# Patient Record
Sex: Female | Born: 1935 | Race: White | Hispanic: No | Smoking: Never smoker
Health system: Southern US, Community
[De-identification: ages and names within clinical notes are randomized; demographics above are authoritative.]

## PROBLEM LIST (undated history)

## (undated) DIAGNOSIS — J449 Chronic obstructive pulmonary disease, unspecified: Secondary | ICD-10-CM

## (undated) DIAGNOSIS — I219 Acute myocardial infarction, unspecified: Secondary | ICD-10-CM

## (undated) DIAGNOSIS — I10 Essential (primary) hypertension: Secondary | ICD-10-CM

## (undated) DIAGNOSIS — E785 Hyperlipidemia, unspecified: Secondary | ICD-10-CM

## (undated) HISTORY — PX: BREAST SURGERY: SHX581

## (undated) HISTORY — PX: ABDOMINAL HYSTERECTOMY: SHX81

---

## 2010-03-19 ENCOUNTER — Encounter (HOSPITAL_COMMUNITY)
Admission: RE | Admit: 2010-03-19 | Discharge: 2010-04-14 | Payer: Self-pay | Source: Home / Self Care | Attending: Cardiology | Admitting: Cardiology

## 2010-04-15 ENCOUNTER — Encounter (HOSPITAL_COMMUNITY): Payer: Medicare Other | Attending: Cardiology

## 2010-04-15 DIAGNOSIS — Z5189 Encounter for other specified aftercare: Secondary | ICD-10-CM | POA: Insufficient documentation

## 2010-04-15 DIAGNOSIS — I10 Essential (primary) hypertension: Secondary | ICD-10-CM | POA: Insufficient documentation

## 2010-04-15 DIAGNOSIS — R0602 Shortness of breath: Secondary | ICD-10-CM | POA: Insufficient documentation

## 2010-04-15 DIAGNOSIS — E7800 Pure hypercholesterolemia, unspecified: Secondary | ICD-10-CM | POA: Insufficient documentation

## 2010-04-15 DIAGNOSIS — I2510 Atherosclerotic heart disease of native coronary artery without angina pectoris: Secondary | ICD-10-CM | POA: Insufficient documentation

## 2010-04-15 DIAGNOSIS — Z8249 Family history of ischemic heart disease and other diseases of the circulatory system: Secondary | ICD-10-CM | POA: Insufficient documentation

## 2010-04-15 DIAGNOSIS — Z87891 Personal history of nicotine dependence: Secondary | ICD-10-CM | POA: Insufficient documentation

## 2010-04-17 ENCOUNTER — Encounter (HOSPITAL_COMMUNITY): Payer: Medicare Other

## 2010-04-20 ENCOUNTER — Encounter (HOSPITAL_COMMUNITY): Payer: Medicare Other

## 2010-04-22 ENCOUNTER — Encounter (HOSPITAL_COMMUNITY): Payer: Medicare Other

## 2010-04-24 ENCOUNTER — Encounter (HOSPITAL_COMMUNITY): Payer: Medicare Other

## 2010-04-27 ENCOUNTER — Encounter (HOSPITAL_COMMUNITY): Payer: Medicare Other

## 2010-04-29 ENCOUNTER — Encounter (HOSPITAL_COMMUNITY): Payer: Medicare Other

## 2010-05-01 ENCOUNTER — Encounter (HOSPITAL_COMMUNITY): Payer: Medicare Other

## 2010-05-04 ENCOUNTER — Encounter (HOSPITAL_COMMUNITY): Payer: Medicare Other

## 2010-05-06 ENCOUNTER — Encounter (HOSPITAL_COMMUNITY): Payer: Medicare Other

## 2010-05-08 ENCOUNTER — Encounter (HOSPITAL_COMMUNITY): Payer: Medicare Other

## 2010-05-11 ENCOUNTER — Encounter (HOSPITAL_COMMUNITY): Payer: Medicare Other

## 2010-05-13 ENCOUNTER — Encounter (HOSPITAL_COMMUNITY): Payer: Medicare Other

## 2010-05-15 ENCOUNTER — Encounter (HOSPITAL_COMMUNITY): Payer: Medicare Other

## 2010-05-18 ENCOUNTER — Encounter (HOSPITAL_COMMUNITY): Payer: Medicare Other

## 2010-05-20 ENCOUNTER — Encounter (HOSPITAL_COMMUNITY): Payer: Medicare Other

## 2010-05-22 ENCOUNTER — Encounter (HOSPITAL_COMMUNITY): Payer: Medicare Other

## 2010-05-25 ENCOUNTER — Encounter (HOSPITAL_COMMUNITY): Payer: Medicare Other

## 2010-05-27 ENCOUNTER — Encounter (HOSPITAL_COMMUNITY): Payer: Medicare Other

## 2010-05-29 ENCOUNTER — Encounter (HOSPITAL_COMMUNITY): Payer: Medicare Other

## 2010-06-01 ENCOUNTER — Encounter (HOSPITAL_COMMUNITY): Payer: Medicare Other

## 2010-06-03 ENCOUNTER — Encounter (HOSPITAL_COMMUNITY): Payer: Medicare Other

## 2010-06-05 ENCOUNTER — Encounter (HOSPITAL_COMMUNITY): Payer: Medicare Other

## 2010-06-08 ENCOUNTER — Encounter (HOSPITAL_COMMUNITY): Payer: Medicare Other

## 2010-06-10 ENCOUNTER — Encounter (HOSPITAL_COMMUNITY): Payer: Medicare Other

## 2010-06-12 ENCOUNTER — Encounter (HOSPITAL_COMMUNITY): Payer: Medicare Other

## 2010-06-15 ENCOUNTER — Encounter (HOSPITAL_COMMUNITY): Payer: Medicare Other

## 2010-06-17 ENCOUNTER — Encounter (HOSPITAL_COMMUNITY): Payer: Medicare Other

## 2010-06-19 ENCOUNTER — Encounter (HOSPITAL_COMMUNITY): Payer: Medicare Other

## 2010-06-22 ENCOUNTER — Encounter (HOSPITAL_COMMUNITY): Payer: Medicare Other

## 2010-06-24 ENCOUNTER — Encounter (HOSPITAL_COMMUNITY): Payer: Medicare Other

## 2010-06-26 ENCOUNTER — Encounter (HOSPITAL_COMMUNITY): Payer: Medicare Other

## 2010-06-29 ENCOUNTER — Encounter (HOSPITAL_COMMUNITY): Payer: Medicare Other

## 2010-07-01 ENCOUNTER — Encounter (HOSPITAL_COMMUNITY): Payer: Medicare Other

## 2010-07-03 ENCOUNTER — Encounter (HOSPITAL_COMMUNITY): Payer: Medicare Other

## 2010-07-06 ENCOUNTER — Encounter (HOSPITAL_COMMUNITY): Payer: Medicare Other

## 2014-04-24 ENCOUNTER — Emergency Department (HOSPITAL_COMMUNITY)
Admission: EM | Admit: 2014-04-24 | Discharge: 2014-04-25 | Disposition: A | Discharge status: Home / Self Care | Payer: Medicare Other | Attending: Emergency Medicine | Admitting: Emergency Medicine

## 2014-04-24 ENCOUNTER — Emergency Department (HOSPITAL_COMMUNITY): Payer: Medicare Other

## 2014-04-24 ENCOUNTER — Encounter (HOSPITAL_COMMUNITY): Payer: Self-pay | Admitting: Emergency Medicine

## 2014-04-24 DIAGNOSIS — Z7902 Long term (current) use of antithrombotics/antiplatelets: Secondary | ICD-10-CM | POA: Diagnosis not present

## 2014-04-24 DIAGNOSIS — K224 Dyskinesia of esophagus: Secondary | ICD-10-CM | POA: Diagnosis not present

## 2014-04-24 DIAGNOSIS — R0789 Other chest pain: Secondary | ICD-10-CM | POA: Insufficient documentation

## 2014-04-24 DIAGNOSIS — Z7982 Long term (current) use of aspirin: Secondary | ICD-10-CM | POA: Insufficient documentation

## 2014-04-24 DIAGNOSIS — E785 Hyperlipidemia, unspecified: Secondary | ICD-10-CM | POA: Insufficient documentation

## 2014-04-24 DIAGNOSIS — Z79899 Other long term (current) drug therapy: Secondary | ICD-10-CM | POA: Diagnosis not present

## 2014-04-24 DIAGNOSIS — J449 Chronic obstructive pulmonary disease, unspecified: Secondary | ICD-10-CM | POA: Insufficient documentation

## 2014-04-24 DIAGNOSIS — R079 Chest pain, unspecified: Secondary | ICD-10-CM | POA: Diagnosis present

## 2014-04-24 DIAGNOSIS — I10 Essential (primary) hypertension: Secondary | ICD-10-CM | POA: Insufficient documentation

## 2014-04-24 HISTORY — DX: Essential (primary) hypertension: I10

## 2014-04-24 HISTORY — DX: Hyperlipidemia, unspecified: E78.5

## 2014-04-24 HISTORY — DX: Chronic obstructive pulmonary disease, unspecified: J44.9

## 2014-04-24 LAB — BASIC METABOLIC PANEL
ANION GAP: 6 (ref 5–15)
BUN: 9 mg/dL (ref 6–23)
CHLORIDE: 103 mmol/L (ref 96–112)
CO2: 28 mmol/L (ref 19–32)
Calcium: 8.7 mg/dL (ref 8.4–10.5)
Creatinine, Ser: 0.67 mg/dL (ref 0.50–1.10)
GFR calc Af Amer: >90 mL/min (ref >90)
GFR calc non Af Amer: 83 mL/min — ABNORMAL LOW (ref >90)
Glucose, Bld: 90 mg/dL (ref 70–99)
POTASSIUM: 4 mmol/L (ref 3.5–5.1)
Sodium: 137 mmol/L (ref 135–145)

## 2014-04-24 LAB — CBC
HEMATOCRIT: 39.2 % (ref 36.0–46.0)
HEMOGLOBIN: 12.8 g/dL (ref 12.0–15.0)
MCH: 27.9 pg (ref 26.0–34.0)
MCHC: 32.7 g/dL (ref 30.0–36.0)
MCV: 85.4 fL (ref 78.0–100.0)
Platelets: 210 10*3/uL (ref 150–400)
RBC: 4.59 MIL/uL (ref 3.87–5.11)
RDW: 14.1 % (ref 11.5–15.5)
WBC: 7.6 10*3/uL (ref 4.0–10.5)

## 2014-04-24 LAB — I-STAT TROPONIN, ED: Troponin i, poc: 0 ng/mL (ref 0.00–0.08)

## 2014-04-24 NOTE — ED Notes (Addendum)
Per EMS, pt comes from home visiting with son and daughter in law, with c/o sudden onset central chest pain radiating to throat.  Pt A&OX4, NAD noted. Pt has h/o MI and GERD. Pt had taken 2 nitro tabs and 324mg  ASA PO before EMS arrival and did not relieve pain. VSS. 1 nitro tab given en route, relieved pain.

## 2014-04-24 NOTE — ED Provider Notes (Signed)
ECG interpretation  Date: 04/24/2014  Rate: 58  Rhythm: normal sinus rhythm  QRS Axis: normal  Intervals: normal  ST/T Wave abnormalities: normal  Conduction Disutrbances: none  Narrative Interpretation:   Old EKG Reviewed: No significant changes noted     Lyanne CoKevin M Milissa Fesperman, MD 04/24/14 2337

## 2014-04-24 NOTE — ED Provider Notes (Signed)
CSN: [id number]     Arrival date & time 04/24/14  2127 History   First MD Initiated Contact with Patient 04/24/14 2128     Chief Complaint  Patient presents with  . Chest Pain     (Consider location/radiation/quality/duration/timing/severity/associated sxs/prior Treatment) HPI Teresa Wright is a 80 y.o. female with a history of MI, hypertension, hyperlipidemia comes in for evaluation of chest discomfort. Patient states earlier this evening at approximately 8:00 PM she began to eat a pasta dinner, when approximately 3 bites in she began to experience a burning sensation in the center of her chest that went from the bottom of her chest to her throat. She rates the discomfort as 8/10. She believes this sensation lasted for approximately 30 minutes until EMS arrived and gave her a sublingual nitroglycerin and then the discomfort resolved. She denies any discomfort now. She reports exercising regularly and has not experienced this discomfort after working out. She denies any chest pain, shortness of breath, cough, hemoptysis, leg swelling or recent travels or surgeries, history of blood clot, numbness or weakness, dizziness, syncope. Patient is on Plavix.  Past Medical History  Diagnosis Date  . Hypertension   . COPD (chronic obstructive pulmonary disease)   . Hyperlipidemia    Past Surgical History  Procedure Laterality Date  . Abdominal hysterectomy     History reviewed. No pertinent family history. History  Substance Use Topics  . Smoking status: Former Games developer  . Smokeless tobacco: Not on file  . Alcohol Use: 6.0 oz/week    10 Glasses of wine per week     Comment: 10 glasses of wine a week   OB History    No data available     Review of Systems  All other systems reviewed and are negative.  A 10 point review of systems was completed and was negative except for pertinent positives and negatives as mentioned in the history of present illness     Allergies  Strawberry  and Penicillins  Home Medications   Prior to Admission medications   Medication Sig Start Date End Date Taking? Authorizing Provider  albuterol (PROVENTIL HFA;VENTOLIN HFA) 108 (90 BASE) MCG/ACT inhaler Inhale 1 puff into the lungs every 6 (six) hours as needed for wheezing or shortness of breath.   Yes Historical Provider, MD  aspirin EC 81 MG tablet Take 81 mg by mouth daily.   Yes Historical Provider, MD  atorvastatin (LIPITOR) 40 MG tablet Take 40 mg by mouth at bedtime.   Yes Historical Provider, MD  cholecalciferol (VITAMIN D) 1000 UNITS tablet Take 1,000 Units by mouth daily.   Yes Historical Provider, MD  ciclesonide (ALVESCO) 160 MCG/ACT inhaler Inhale 2 puffs into the lungs 2 (two) times daily.   Yes Historical Provider, MD  clopidogrel (PLAVIX) 75 MG tablet Take 75 mg by mouth daily.   Yes Historical Provider, MD  Menthol-Methyl Salicylate (MUSCLE RUB) 10-15 % CREA Apply 1 application topically as needed for muscle pain.   Yes Historical Provider, MD  metoprolol succinate (TOPROL-XL) 25 MG 24 hr tablet Take 25 mg by mouth daily.   Yes Historical Provider, MD  montelukast (SINGULAIR) 10 MG tablet Take 10 mg by mouth at bedtime.   Yes Historical Provider, MD  nitroGLYCERIN (NITROSTAT) 0.4 MG SL tablet Place 0.4 mg under the tongue every 5 (five) minutes as needed for chest pain.   Yes Historical Provider, MD  OVER THE COUNTER MEDICATION Place 1 drop into both eyes at bedtime.    Yes  Historical Provider, MD  sertraline (ZOLOFT) 50 MG tablet Take 50 mg by mouth daily.   Yes Historical Provider, MD  tiotropium (SPIRIVA) 18 MCG inhalation capsule Place 18 mcg into inhaler and inhale daily.   Yes Historical Provider, MD  valsartan (DIOVAN) 160 MG tablet Take 160 mg by mouth daily.   Yes Historical Provider, MD   BP 152/95 mmHg  Pulse 59  Temp(Src) 98.1 F (36.7 C) (Oral)  Resp 13  Ht  (1.549 m)  Wt 103 lb (46.72 kg)  BMI 19.47 kg/m2  SpO2 92% Physical Exam  Constitutional: She  is oriented to person, place, and time. She appears well-developed and well-nourished.  HENT:  Head: Normocephalic and atraumatic.  Mouth/Throat: Oropharynx is clear and moist.  Eyes: Conjunctivae are normal. Pupils are equal, round, and reactive to light. Right eye exhibits no discharge. Left eye exhibits no discharge. No scleral icterus.  Neck: Neck supple.  Cardiovascular: Normal rate, regular rhythm and normal heart sounds.   Pulmonary/Chest: Effort normal. No respiratory distress. She has no wheezes. She has no rales.  Diminished breath sounds in all fields.  Abdominal: Soft. There is no tenderness.  Musculoskeletal: She exhibits no tenderness.  Neurological: She is alert and oriented to person, place, and time.  Cranial Nerves II-XII grossly intact  Skin: Skin is warm and dry. No rash noted.  Psychiatric: She has a normal mood and affect.  Nursing note and vitals reviewed.   ED Course  Procedures (including critical care time) Labs Review Labs Reviewed  BASIC METABOLIC PANEL - Abnormal; Notable for the following:    GFR calc non Af Amer 83 (*)    All other components within normal limits  CBC  I-STAT TROPOININ, ED    Imaging Review Dg Chest 2 View  04/24/2014   CLINICAL DATA:  Chest pain and shortness of breath.  EXAM: CHEST - 2 VIEW  COMPARISON:  None  FINDINGS: The heart size and mediastinal contours are within normal limits. There is evidence of underlying COPD. Bibasilar scarring and atelectasis identified. There is no evidence of pulmonary edema, consolidation, pneumothorax, nodule or pleural fluid. The visualized skeletal structures are unremarkable.  IMPRESSION: COPD with bibasilar scarring/ atelectasis.  No active disease.   Electronically Signed   By: Irish Lack M.D.   On: 04/24/2014 22:12     EKG Interpretation None     Meds given in ED:  Medications - No data to display  New Prescriptions   No medications on file   Filed Vitals:   04/24/14 2215  04/24/14 2230 04/24/14 2245 04/24/14 2300  BP: 152/61 169/57 156/62 152/95  Pulse: 55 63 56 59  Temp:      TempSrc:      Resp: Height:      Weight:      SpO2: 95% 95% 95% 92%    MDM  Vitals stable - WNL -afebrile Pt resting comfortably in ED. Reports she feels well and is ready to go home. PE--not concerning further acute or emergent pathology Labwork noncontributory Imaging--chest x-ray shows no acute cardiopulmonary pathology. No evidence of pneumothorax or esophageal rupture. No free air  DDX--patient likely suffered from esophageal spasm. Discussed f/u with gastroenterology if symptoms reoccur. Clinical picture not consistent with ACS, aortic dissection. Low Wells score, doubt PE. No PTX on CXR.  I discussed all relevant lab findings and imaging results with pt and they verbalized understanding. Discussed f/u with PCP within 48 hrs and return precautions,  pt very amenable to plan.  Prior to patient discharge, I discussed and reviewed this case with Dr. Patria Maneampos, who also saw and evaluated the patient  Final diagnoses:  Chest discomfort  Esophageal spasm        Sharlene MottsBenjamin W Eulia Hatcher, PA-C 04/25/14 1242  Lyanne CoKevin M Campos, MD 04/26/14 [phone]

## 2014-04-25 NOTE — Discharge Instructions (Signed)
Esophageal Spasm °Esophageal spasm is an uncoordinated contraction of the muscles of the esophagus (the tube which carries food from your mouth to your stomach). Normally, the muscles of the esophagus alternate between contraction and relaxation starting from the top of the esophagus and working down to the bottom. This moves the food from the mouth to the stomach. In esophageal spasm, all the muscles contract at once. This causes pain and fails to move the food along. As a result, you may have trouble swallowing.  °Women are more likely than men to have esophageal spasm. The cause of the spasms is not known. Sometimes eating hot or cold foods triggers the condition and this may be due to an overly sensitive esophagus. This is not an infectious disease and cannot be passed to others. °SYMPTOMS  °Symptoms of esophageal spasm may include: chest pain, burning or pain with swallowing, and difficulty swallowing.  °DIAGNOSIS  °Esophageal spasm can be diagnosed by a test called manometry (pressure studies of the esophagus). In this test, a special tube is inserted down the esophagus. The tube measures the muscle activity of the esophagus. Abnormal contractions mixed with normal movement helps confirm the diagnosis.  °A person with a hypersensitive esophagus may be diagnosed by inflating a long balloon in the person's esophagus. If this causes the same symptoms, preventive methods may work. °PREVENTION  °Avoid hot or cold foods if that seems to be a trigger. °PROGNOSIS  °This condition does not go away, nor is treatment entirely satisfactory. Patients need to be careful of what they eat. They need to continue on medication if a useful one is found. Fortunately, the condition does not get progressively worse as time passes. °Esophageal spasm does not usually lead to more serious problems but sometimes the pain can be disabling. If a person becomes afraid to eat they may become malnourished and lose weight.  °TREATMENT  °· A  procedure in which instruments of increasing size are inserted through the esophagus to enlarge (dilate) it are used. °· Medications that decrease acid-production of the stomach may be used such as proton-pump inhibitors or H2-blockers. °· Medications of several types can be used to relax the muscles of the esophagus. °· An individual with a hypersensitive esophagus sometimes improves with low doses of medications normally used for depression. °· No treatment for esophageal spasm is effective for everyone. Often several approaches will be tried before one works. In many cases, the symptoms will improve, but will not go away completely. °· For severe cases, relief is obtained two-thirds of the time by cutting the muscles along the entire length of the esophagus. This is a major surgical procedure. °· Your symptoms are usually the best guide to how well the treatment for esophageal spasm works. °SIDE EFFECTS OF TREATMENTS °· Nitrates can cause headaches and low blood pressure. °· Calcium channel blockers can cause: °¨ Feeling sick to your stomach (nausea). °¨ Constipation and other side effects. °· Antidepressants can cause side effects that depend on the medication used. °HOME CARE INSTRUCTIONS  °· Let your caregiver know if problems are getting worse, or if you get food stuck in your esophagus for longer than 1 hour or as directed and are unable to swallow liquid. °· Take medications as directed and with permission of your caregiver. Ask about what to do if a medication seems to get stuck in your esophagus. Only take over-the-counter or prescription medicines for pain, discomfort, or fever as directed by your caregiver. °· Soft and liquid foods   pass more easily than solid pieces. °SEEK IMMEDIATE MEDICAL CARE IF:  °· You develop severe chest pain, especially if the pain is crushing or pressure-like and spreads to the arms, back, neck, or jaw, or if you have sweating, nausea, or shortness of breath. THIS COULD BE AN  EMERGENCY. Do not wait to see if the pain will go away. Get medical help at once. Call 911 or 0 (operator). DO NOT drive yourself to the hospital. °· Your chest pain gets worse and does not go away with rest. °· You have an attack of chest pain lasting longer than usual despite rest and treatment with the medications your physician has prescribed. °· You wake from sleep with chest pain or shortness of breath. °· You feel dizzy or faint. °· You have chest pain, not typical of your usual pain, caused by your esophagus for which you originally saw your caregiver. °MAKE SURE YOU:  °· Understand these instructions. °· Will watch your condition. °· Will get help right away if you are not doing well or get worse. °Document Released: 05/22/2002 Document Revised: 05/24/2011 Document Reviewed: 05/25/2013 °ExitCare® Patient Information ©2015 [employer]. This information is not intended to replace advice given to you by your health care provider. Make sure you discuss any questions you have with your health care provider. ° °

## 2015-11-21 IMAGING — CR DG CHEST 2V
2 series · 2 of 2 positions shown · non-contrast
Comparison: None

CLINICAL DATA: Chest pain and shortness of breath.

EXAM:
CHEST - 2 VIEW

[chest pa]
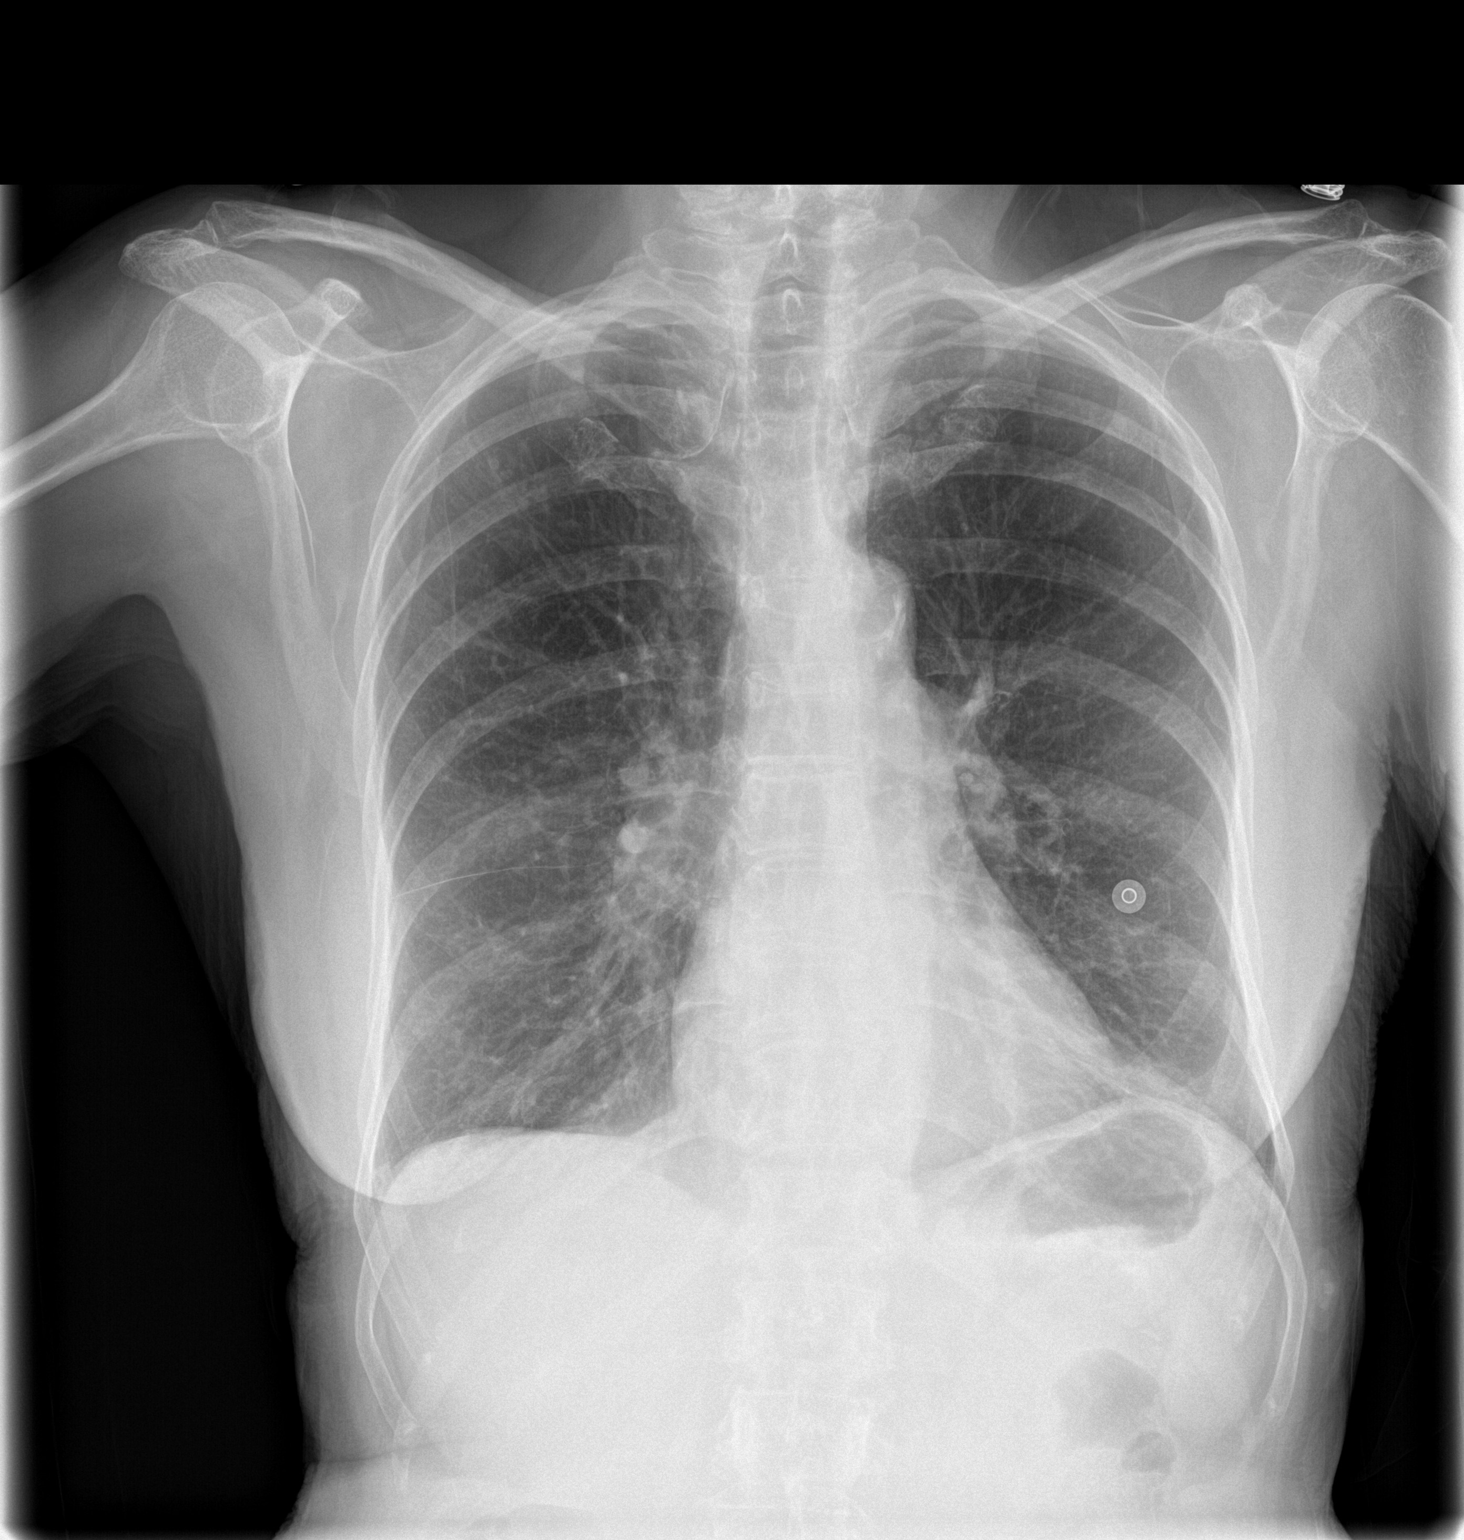

[chest lat]
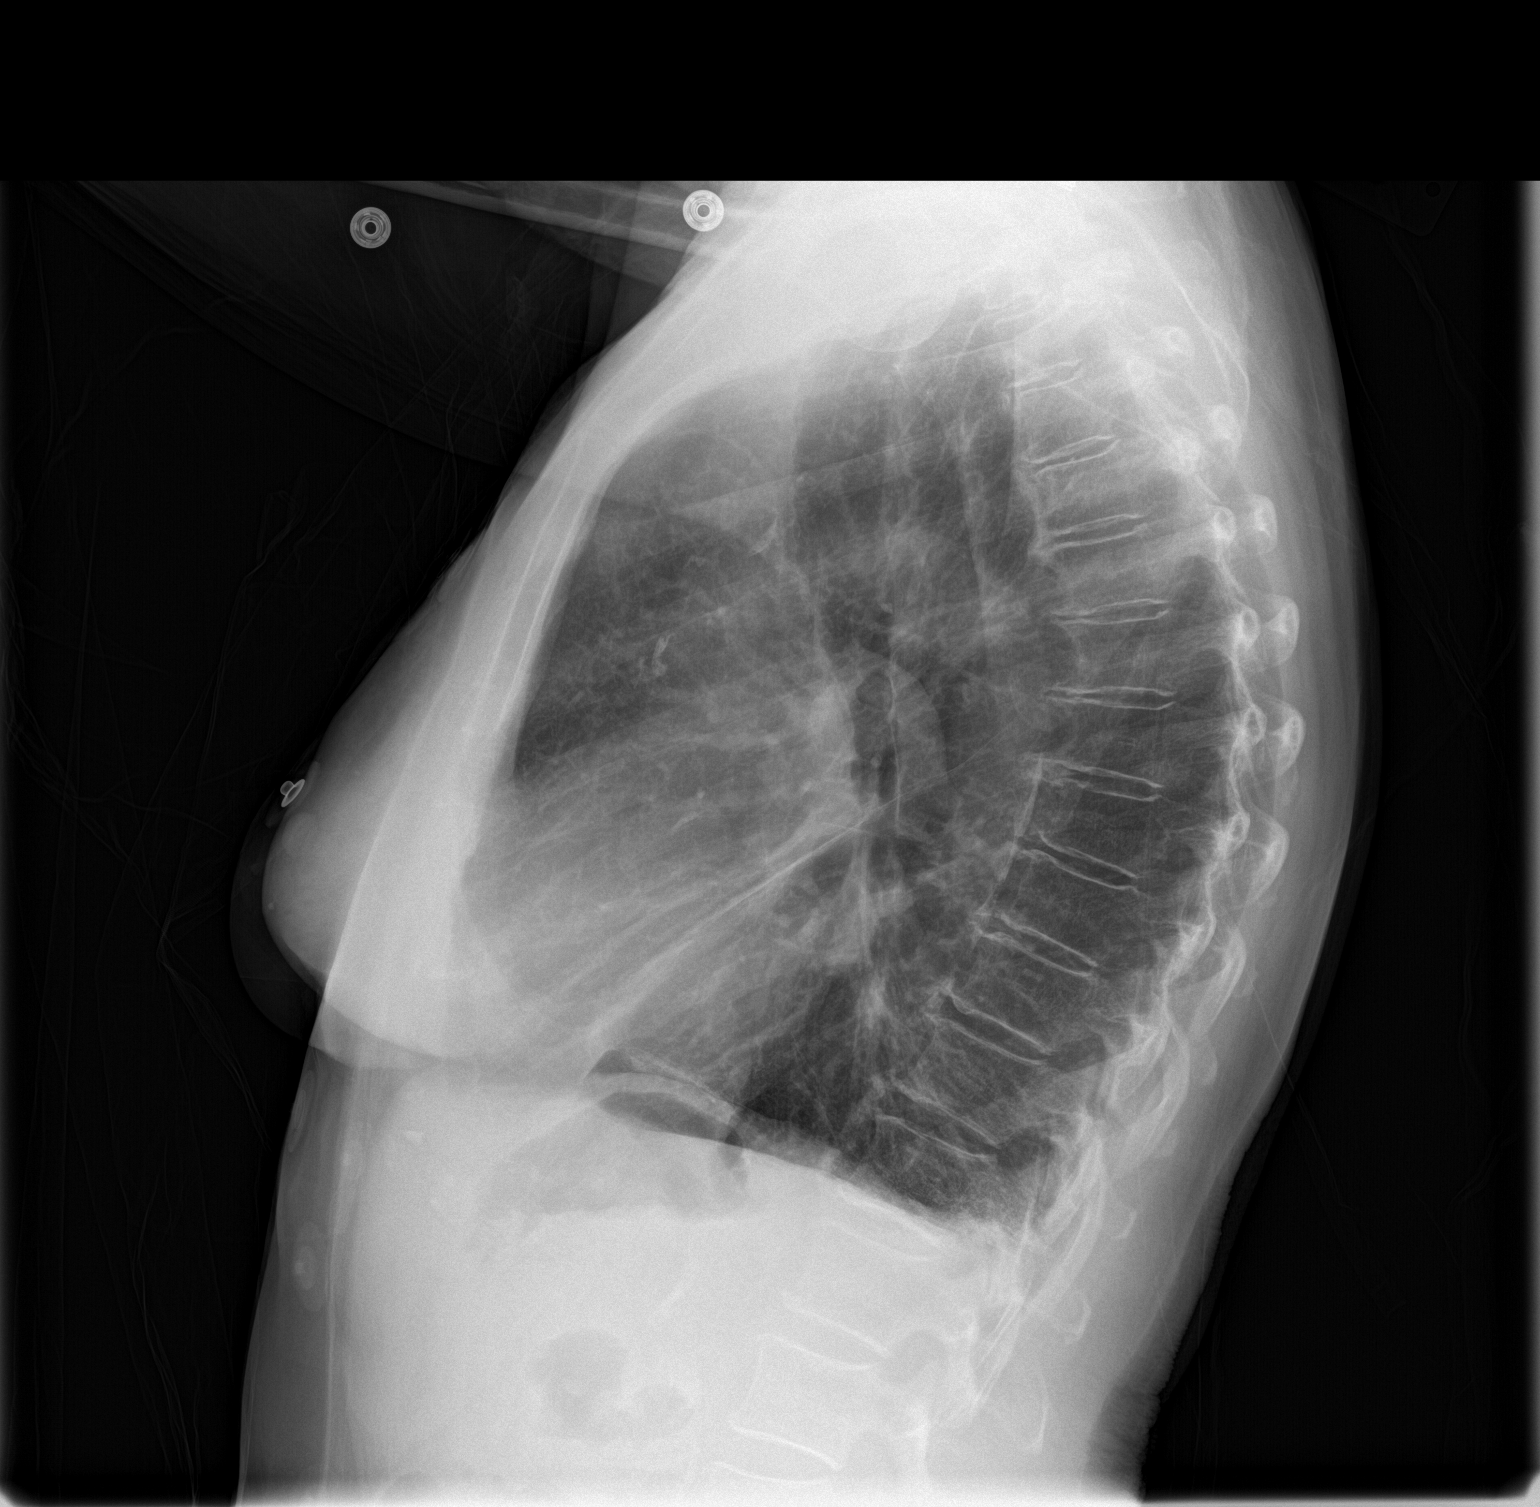

[2 of 2 positions shown; findings below may reference images not displayed]

FINDINGS: The heart size and mediastinal contours are within normal limits.
There is evidence of underlying COPD. Bibasilar scarring and
atelectasis identified. There is no evidence of pulmonary edema,
consolidation, pneumothorax, nodule or pleural fluid. The visualized
skeletal structures are unremarkable.
IMPRESSION: COPD with bibasilar scarring/ atelectasis.  No active disease.

## 2022-03-21 ENCOUNTER — Encounter (HOSPITAL_COMMUNITY): Payer: Self-pay

## 2022-03-21 ENCOUNTER — Inpatient Hospital Stay (HOSPITAL_BASED_OUTPATIENT_CLINIC_OR_DEPARTMENT_OTHER)
Admission: EM | Admit: 2022-03-21 | Discharge: 2022-04-15 | DRG: 329 | Disposition: E | Payer: Medicare Other | Attending: Student | Admitting: Student

## 2022-03-21 ENCOUNTER — Inpatient Hospital Stay (HOSPITAL_COMMUNITY): Payer: Medicare Other

## 2022-03-21 ENCOUNTER — Encounter (HOSPITAL_BASED_OUTPATIENT_CLINIC_OR_DEPARTMENT_OTHER): Payer: Self-pay

## 2022-03-21 ENCOUNTER — Emergency Department (HOSPITAL_BASED_OUTPATIENT_CLINIC_OR_DEPARTMENT_OTHER): Payer: Medicare Other

## 2022-03-21 ENCOUNTER — Other Ambulatory Visit: Payer: Self-pay

## 2022-03-21 DIAGNOSIS — E872 Acidosis, unspecified: Secondary | ICD-10-CM | POA: Diagnosis not present

## 2022-03-21 DIAGNOSIS — K56609 Unspecified intestinal obstruction, unspecified as to partial versus complete obstruction: Secondary | ICD-10-CM | POA: Diagnosis not present

## 2022-03-21 DIAGNOSIS — A419 Sepsis, unspecified organism: Secondary | ICD-10-CM

## 2022-03-21 DIAGNOSIS — N179 Acute kidney failure, unspecified: Secondary | ICD-10-CM | POA: Insufficient documentation

## 2022-03-21 DIAGNOSIS — J449 Chronic obstructive pulmonary disease, unspecified: Secondary | ICD-10-CM | POA: Insufficient documentation

## 2022-03-21 DIAGNOSIS — R739 Hyperglycemia, unspecified: Secondary | ICD-10-CM | POA: Insufficient documentation

## 2022-03-21 DIAGNOSIS — I2510 Atherosclerotic heart disease of native coronary artery without angina pectoris: Secondary | ICD-10-CM | POA: Insufficient documentation

## 2022-03-21 DIAGNOSIS — I10 Essential (primary) hypertension: Secondary | ICD-10-CM | POA: Insufficient documentation

## 2022-03-21 DIAGNOSIS — J9601 Acute respiratory failure with hypoxia: Secondary | ICD-10-CM | POA: Diagnosis not present

## 2022-03-21 DIAGNOSIS — F10939 Alcohol use, unspecified with withdrawal, unspecified: Secondary | ICD-10-CM | POA: Diagnosis present

## 2022-03-21 DIAGNOSIS — Z66 Do not resuscitate: Secondary | ICD-10-CM | POA: Diagnosis present

## 2022-03-21 DIAGNOSIS — F419 Anxiety disorder, unspecified: Secondary | ICD-10-CM | POA: Diagnosis present

## 2022-03-21 DIAGNOSIS — K660 Peritoneal adhesions (postprocedural) (postinfection): Secondary | ICD-10-CM | POA: Diagnosis present

## 2022-03-21 DIAGNOSIS — I252 Old myocardial infarction: Secondary | ICD-10-CM | POA: Diagnosis not present

## 2022-03-21 DIAGNOSIS — I878 Other specified disorders of veins: Secondary | ICD-10-CM | POA: Diagnosis present

## 2022-03-21 DIAGNOSIS — F29 Unspecified psychosis not due to a substance or known physiological condition: Secondary | ICD-10-CM | POA: Diagnosis not present

## 2022-03-21 DIAGNOSIS — K55029 Acute infarction of small intestine, extent unspecified: Secondary | ICD-10-CM | POA: Diagnosis present

## 2022-03-21 DIAGNOSIS — R6521 Severe sepsis with septic shock: Secondary | ICD-10-CM | POA: Diagnosis not present

## 2022-03-21 DIAGNOSIS — Z681 Body mass index (BMI) 19 or less, adult: Secondary | ICD-10-CM | POA: Diagnosis not present

## 2022-03-21 DIAGNOSIS — R042 Hemoptysis: Secondary | ICD-10-CM | POA: Diagnosis not present

## 2022-03-21 DIAGNOSIS — N170 Acute kidney failure with tubular necrosis: Secondary | ICD-10-CM | POA: Diagnosis not present

## 2022-03-21 DIAGNOSIS — Z90710 Acquired absence of both cervix and uterus: Secondary | ICD-10-CM

## 2022-03-21 DIAGNOSIS — J690 Pneumonitis due to inhalation of food and vomit: Secondary | ICD-10-CM | POA: Diagnosis not present

## 2022-03-21 DIAGNOSIS — R34 Anuria and oliguria: Secondary | ICD-10-CM | POA: Diagnosis not present

## 2022-03-21 DIAGNOSIS — I959 Hypotension, unspecified: Secondary | ICD-10-CM | POA: Diagnosis not present

## 2022-03-21 DIAGNOSIS — Z781 Physical restraint status: Secondary | ICD-10-CM

## 2022-03-21 DIAGNOSIS — D689 Coagulation defect, unspecified: Secondary | ICD-10-CM | POA: Diagnosis not present

## 2022-03-21 DIAGNOSIS — K7200 Acute and subacute hepatic failure without coma: Secondary | ICD-10-CM | POA: Diagnosis not present

## 2022-03-21 DIAGNOSIS — R54 Age-related physical debility: Secondary | ICD-10-CM | POA: Diagnosis present

## 2022-03-21 DIAGNOSIS — D62 Acute posthemorrhagic anemia: Secondary | ICD-10-CM | POA: Diagnosis not present

## 2022-03-21 DIAGNOSIS — G9341 Metabolic encephalopathy: Secondary | ICD-10-CM | POA: Diagnosis not present

## 2022-03-21 DIAGNOSIS — E162 Hypoglycemia, unspecified: Secondary | ICD-10-CM | POA: Diagnosis present

## 2022-03-21 DIAGNOSIS — F1090 Alcohol use, unspecified, uncomplicated: Secondary | ICD-10-CM | POA: Diagnosis present

## 2022-03-21 DIAGNOSIS — R112 Nausea with vomiting, unspecified: Secondary | ICD-10-CM | POA: Diagnosis present

## 2022-03-21 DIAGNOSIS — K760 Fatty (change of) liver, not elsewhere classified: Secondary | ICD-10-CM | POA: Diagnosis present

## 2022-03-21 DIAGNOSIS — R7401 Elevation of levels of liver transaminase levels: Secondary | ICD-10-CM | POA: Diagnosis not present

## 2022-03-21 DIAGNOSIS — E440 Moderate protein-calorie malnutrition: Secondary | ICD-10-CM | POA: Diagnosis present

## 2022-03-21 DIAGNOSIS — Z7902 Long term (current) use of antithrombotics/antiplatelets: Secondary | ICD-10-CM

## 2022-03-21 DIAGNOSIS — Z515 Encounter for palliative care: Secondary | ICD-10-CM

## 2022-03-21 DIAGNOSIS — Z880 Allergy status to penicillin: Secondary | ICD-10-CM

## 2022-03-21 HISTORY — DX: Acute myocardial infarction, unspecified: I21.9

## 2022-03-21 HISTORY — DX: Essential (primary) hypertension: I10

## 2022-03-21 HISTORY — DX: Chronic obstructive pulmonary disease, unspecified: J44.9

## 2022-03-21 LAB — COMPREHENSIVE METABOLIC PANEL
ALT: 14 U/L (ref 0–44)
AST: 16 U/L (ref 15–41)
Albumin: 3.9 g/dL (ref 3.5–5.0)
Alkaline Phosphatase: 78 U/L (ref 38–126)
Anion gap: 17 — ABNORMAL HIGH (ref 5–15)
BUN: 17 mg/dL (ref 8–23)
CO2: 26 mmol/L (ref 22–32)
Calcium: 9.5 mg/dL (ref 8.9–10.3)
Chloride: 100 mmol/L (ref 98–111)
Creatinine, Ser: 0.63 mg/dL (ref 0.44–1.00)
GFR, Estimated: >60 mL/min (ref >60)
Glucose, Bld: 163 mg/dL — ABNORMAL HIGH (ref 70–99)
Potassium: 3.9 mmol/L (ref 3.5–5.1)
Sodium: 143 mmol/L (ref 135–145)
Total Bilirubin: 0.5 mg/dL (ref 0.3–1.2)
Total Protein: 7.1 g/dL (ref 6.5–8.1)

## 2022-03-21 LAB — CBC
HCT: 47.3 % — ABNORMAL HIGH (ref 36.0–46.0)
Hemoglobin: 15 g/dL (ref 12.0–15.0)
MCH: 28.2 pg (ref 26.0–34.0)
MCHC: 31.7 g/dL (ref 30.0–36.0)
MCV: 88.9 fL (ref 80.0–100.0)
Platelets: 394 10*3/uL (ref 150–400)
RBC: 5.32 MIL/uL — ABNORMAL HIGH (ref 3.87–5.11)
RDW: 14.1 % (ref 11.5–15.5)
WBC: 9 10*3/uL (ref 4.0–10.5)
nRBC: 0 % (ref 0.0–0.2)

## 2022-03-21 LAB — LIPASE, BLOOD: Lipase: 16 U/L (ref 11–51)

## 2022-03-21 LAB — TROPONIN I (HIGH SENSITIVITY): Troponin I (High Sensitivity): 6 ng/L (ref <18)

## 2022-03-21 MED ORDER — IOHEXOL 300 MG/ML  SOLN
100.0000 mL | Freq: Once | INTRAMUSCULAR | Status: AC | PRN
  Administered 2022-03-21: 80 mL via INTRAVENOUS

## 2022-03-21 MED ORDER — HYDRALAZINE HCL 20 MG/ML IJ SOLN: 10.0000 mg | Freq: Three times a day (TID) | INTRAMUSCULAR | Status: DC | PRN

## 2022-03-21 MED ORDER — SODIUM CHLORIDE 0.9 % IV SOLN: INTRAVENOUS | Status: DC

## 2022-03-21 MED ORDER — SODIUM CHLORIDE 0.9 % IV BOLUS
1000.0000 mL | Freq: Once | INTRAVENOUS | Status: AC
  Administered 2022-03-21: 1000 mL via INTRAVENOUS

## 2022-03-21 MED ORDER — HYDROMORPHONE HCL 1 MG/ML IJ SOLN
0.5000 mg | INTRAMUSCULAR | Status: DC | PRN
  Administered 2022-03-21 – 2022-03-25 (×10): 0.5 mg via INTRAVENOUS
  Filled 2022-03-21 (×3): qty 1
  Filled 2022-03-21: qty 0.5
  Filled 2022-03-21: qty 1
  Filled 2022-03-21: qty 0.5
  Filled 2022-03-21: qty 1
  Filled 2022-03-21 (×2): qty 0.5
  Filled 2022-03-21 (×2): qty 1
  Filled 2022-03-21: qty 0.5

## 2022-03-21 MED ORDER — HYDROMORPHONE HCL 1 MG/ML IJ SOLN
0.5000 mg | Freq: Once | INTRAMUSCULAR | Status: AC
  Administered 2022-03-21: 0.5 mg via INTRAVENOUS
  Filled 2022-03-21: qty 1

## 2022-03-21 MED ORDER — ONDANSETRON HCL 4 MG/2ML IJ SOLN
4.0000 mg | Freq: Once | INTRAMUSCULAR | Status: AC | PRN
  Administered 2022-03-21: 4 mg via INTRAVENOUS
  Filled 2022-03-21: qty 2

## 2022-03-21 MED ORDER — KETOROLAC TROMETHAMINE 15 MG/ML IJ SOLN
15.0000 mg | Freq: Once | INTRAMUSCULAR | Status: AC
  Administered 2022-03-21: 15 mg via INTRAVENOUS
  Filled 2022-03-21: qty 1

## 2022-03-21 MED ORDER — PROCHLORPERAZINE EDISYLATE 10 MG/2ML IJ SOLN
10.0000 mg | Freq: Four times a day (QID) | INTRAMUSCULAR | Status: DC | PRN
  Administered 2022-03-21: 10 mg via INTRAVENOUS
  Filled 2022-03-21: qty 2

## 2022-03-21 MED ORDER — HYDRALAZINE HCL 20 MG/ML IJ SOLN: 10.0000 mg | Freq: Once | INTRAMUSCULAR | Status: DC

## 2022-03-21 MED ORDER — SODIUM CHLORIDE 0.9 % IV BOLUS
500.0000 mL | Freq: Once | INTRAVENOUS | Status: AC
  Administered 2022-03-21: 500 mL via INTRAVENOUS

## 2022-03-21 MED ORDER — DIATRIZOATE MEGLUMINE & SODIUM 66-10 % PO SOLN
90.0000 mL | Freq: Once | ORAL | Status: AC
  Administered 2022-03-21: 90 mL via NASOGASTRIC
  Filled 2022-03-21: qty 90

## 2022-03-21 MED ORDER — HYDROMORPHONE HCL 1 MG/ML IJ SOLN
1.0000 mg | Freq: Once | INTRAMUSCULAR | Status: AC
  Administered 2022-03-21: 1 mg via INTRAVENOUS
  Filled 2022-03-21: qty 1

## 2022-03-21 MED ORDER — DIATRIZOATE MEGLUMINE & SODIUM 66-10 % PO SOLN: 90.0000 mL | Freq: Once | ORAL | Status: DC

## 2022-03-21 NOTE — ED Notes (Signed)
Pt. C/o of increase in pain level. Dr. Sedonia Small aware and in to see pt.

## 2022-03-21 NOTE — ED Triage Notes (Signed)
Pt to ED via pov from home. Pt started having abdominal pain and vomiting approximately 4hrs ago. Pt has had pepto-bismol w/o relief. Pt denies diarrhea, constipation, and/or fevers.

## 2022-03-21 NOTE — ED Provider Notes (Signed)
  Physical Exam  BP (!) 195/85   Pulse 70   Temp (!) 94.3 F (34.6 C) (Oral)   Resp 11   Ht 1.549 m (5\' 1" )   Wt 42.6 kg   SpO2 (!) 89%   BMI 17.76 kg/m   Physical Exam  Procedures  Procedures  ED Course / MDM    Medical Decision Making Amount and/or Complexity of Data Reviewed Labs: ordered. Radiology: ordered.  Risk Prescription drug management. Decision regarding hospitalization.   88 yo female presents with abdominal pain, nausea and vomiting And found to have sbo on ct Accepted to hospitalist at [facility] and general surgery consulted Hydralazine and ng ordered       Pattricia Boss, MD 03/23/22 1200

## 2022-03-21 NOTE — Progress Notes (Signed)
Plan of Care Note for accepted transfer   Patient: Teresa Wright MRN: [id number]   DOA: 03/21/2022  Facility requesting transfer: [facility] Requesting Provider: Dr. Sedonia Small Reason for transfer: SBO Facility course: 20 F w/ PMHx of HTN, COPD. Presenting with N/V, ab pain. Acute pain starting last night. W/u revealed high-grade SBO. She was started on SBO protocol. EDP spoke with Dr. Dema Severin (General Surgery). They will follow. She is hypertensive. Getting PRN hydralazine.  Plan of care: The patient is accepted for admission to Progressive unit, at [facility]. While holding at [facility], medical decision making responsibilities remain with the [facility]. Upon arrival to [facility], [facility] will assume care. Thank you.   Author: Jonnie Finner, DO 03/21/2022  Check [email or web address] for on-call coverage.  Nursing staff, Please call [place] number on Amion as soon as patient's arrival, so appropriate admitting provider can evaluate the pt.

## 2022-03-21 NOTE — ED Notes (Signed)
Called [facility] and spoke to [facility]; informed her that the patient Bed Assignment is READY

## 2022-03-21 NOTE — ED Notes (Signed)
Purewick Cath placed. Tolerated well.

## 2022-03-21 NOTE — H&P (Signed)
History and Physical    Patient: Teresa Wright CBS:[id number] DOB: 1934/05/22 DOA: 03/21/2022 DOS: the patient was seen and examined on 03/21/2022 PCP: Pcp, No  Patient coming from: Home  Chief Complaint:  Chief Complaint  Patient presents with   Abdominal Pain   HPI: SHANTELLE ALLES is a 88 y.o. female with medical history significant of HTN, CAD, COPD. Presenting with abdominal pain. Her symptoms started last night around [place]. She woke with gassy, indigestion like pain across the top of her stomach. She tried some pepto bismol to help, but she just vomited it up. She didn't have any fever or diarrhea. Her pain worsened to include her flanks. When her symptoms did not improve this morning, she decided to come to the ED for evaluation. She denies any other aggravating or alleviating factors.    Review of Systems: As mentioned in the history of present illness. All other systems reviewed and are negative. Past Medical History:  Diagnosis Date   COPD (chronic obstructive pulmonary disease) (HCC)    Hypertension    MI (myocardial infarction) (HCC)    Past Surgical History:  Procedure Laterality Date   ABDOMINAL HYSTERECTOMY     BREAST SURGERY     Social History:  reports that she has never smoked. She has never used smokeless tobacco. She reports current alcohol use. She reports that she does not use drugs.  Allergies  Allergen Reactions   Penicillins     History reviewed. No pertinent family history.  Prior to Admission medications   Not on File    Physical Exam: Vitals:   03/21/22 0800 03/21/22 0805 03/21/22 0815 03/21/22 0830  BP: (!) 202/68   (!) 178/72  Pulse: 66 66 66 68  Resp: 16 14 12 12   Temp:      TempSrc:      SpO2: (!) 88% (!) 88% (!) 87% 90%  Weight:      Height:       General: 88 y.o. female resting in bed in NAD Eyes: PERRL, normal sclera ENMT: Nares patent w/o discharge, orophaynx clear, dentition normal, ears w/o  discharge/lesions/ulcers Neck: Supple, trachea midline Cardiovascular: RRR, +S1, S2, no m/g/r, equal pulses throughout Respiratory: CTABL, no w/r/r, normal WOB GI: BS hypoactive, ND, soft, TTP globally, no masses noted, no organomegaly noted, NGT in place MSK: No e/c/c Neuro: A&O x 3, no focal deficits Psyc: Appropriate interaction and affect, calm/cooperative  Data Reviewed:  Results for orders placed or performed during the hospital encounter of 03/21/22 (from the past 24 hour(s))  Lipase, blood     Status: None   Collection Time: 03/21/22  4:47 AM  Result Value Ref Range   Lipase 16 11 - 51 U/L  Comprehensive metabolic panel     Status: Abnormal   Collection Time: 03/21/22  4:47 AM  Result Value Ref Range   Sodium 143 135 - 145 mmol/L   Potassium 3.9 3.5 - 5.1 mmol/L   Chloride 100 98 - 111 mmol/L   CO2 26 22 - 32 mmol/L   Glucose, Bld 163 (H) 70 - 99 mg/dL   BUN 17 8 - 23 mg/dL   Creatinine, Ser 05/20/22 0.44 - 1.00 mg/dL   Calcium 9.5 8.9 - 8.46 mg/dL   Total Protein 7.1 6.5 - 8.1 g/dL   Albumin 3.9 3.5 - 5.0 g/dL   AST 16 15 - 41 U/L   ALT 14 0 - 44 U/L   Alkaline Phosphatase 78 38 - 126 U/L  Total Bilirubin 0.5 0.3 - 1.2 mg/dL   GFR, Estimated >60 >60 mL/min   Anion gap 17 (H) 5 - 15  CBC     Status: Abnormal   Collection Time: 03/21/22  4:47 AM  Result Value Ref Range   WBC 9.0 4.0 - 10.5 K/uL   RBC 5.32 (H) 3.87 - 5.11 MIL/uL   Hemoglobin 15.0 12.0 - 15.0 g/dL   HCT 47.3 (H) 36.0 - 46.0 %   MCV 88.9 80.0 - 100.0 fL   MCH 28.2 26.0 - 34.0 pg   MCHC 31.7 30.0 - 36.0 g/dL   RDW 14.1 11.5 - 15.5 %   Platelets 394 150 - 400 K/uL   nRBC 0.0 0.0 - 0.2 %  Troponin I (High Sensitivity)     Status: None   Collection Time: 03/21/22  4:47 AM  Result Value Ref Range   Troponin I (High Sensitivity) 6 <18 ng/L   CT ab/pelvis: 1. High-grade small bowel obstruction with transition point in the right lower quadrant. Associated mesenteric edema and trace ascites, no  perforation. 2. Advanced atherosclerosis with high-grade bilateral inflow stenoses.  CXR: The side port and distal tip of the NG tube are in the stomach as above.  Assessment and Plan: SBO     - admitted to inpt, progressive     - SBO protocol, NGT in place, keep NPO for now     - General surgery consulted     - fluids, anti-emetics, pain control  HTN     - PRN hydralazine for now     - resume home regimen when confirmed and off NPO status  CAD     - resume home regimen when confirmed and off NPO status  COPD     - resume home regimen when confirmed  Hyperglycemia     - check A1c  Advance Care Planning:   Code Status: DNR; confirmed through multiple lines of questioning  Consults: General Surgery  Family Communication: w/ daughter at bedside  Severity of Illness: The appropriate patient status for this patient is INPATIENT. Inpatient status is judged to be reasonable and necessary in order to provide the required intensity of service to ensure the patient's safety. The patient's presenting symptoms, physical exam findings, and initial radiographic and laboratory data in the context of their chronic comorbidities is felt to place them at high risk for further clinical deterioration. Furthermore, it is not anticipated that the patient will be medically stable for discharge from the hospital within 2 midnights of admission.   * I certify that at the point of admission it is my clinical judgment that the patient will require inpatient hospital care spanning beyond 2 midnights from the point of admission due to high intensity of service, high risk for further deterioration and high frequency of surveillance required.*  Author: Jonnie Finner, DO 03/21/2022 9:41 AM  For on call review [email or web address].

## 2022-03-21 NOTE — ED Notes (Signed)
Pt alert, NAD, calm, interactive, resps e/u. SPO2 low at 88% on 2L [place] with NGT in R nare intermittent low wall suction. O2 increased to 4L [place]. Will monitor. BP improved.

## 2022-03-21 NOTE — Consult Note (Signed)
Reason for Consult:abd pain Referring Physician: Dr. [name] is an 88 y.o. female.  HPI: The patient is an 88 year old white female who presents with abdominal pain that has been occurring over the last 3 weeks.  The pain seems to come and go but has gotten steadily worse.  She denies any nausea or vomiting.  She states that she has been passing a small amount of flatus and having small bowel movements.  She denies any fevers or chills.  She went to the emergency department where a CT scan was suggestive of a small bowel obstruction with a transition point on the right.  She does have COPD and often times uses oxygen at night.  She also takes Plavix and had her last dose last night  Past Medical History:  Diagnosis Date   COPD (chronic obstructive pulmonary disease) ([place])    Hypertension    MI (myocardial infarction) ([place])     Past Surgical History:  Procedure Laterality Date   ABDOMINAL HYSTERECTOMY     BREAST SURGERY      History reviewed. No pertinent family history.  Social History:  reports that she has never smoked. She has never used smokeless tobacco. She reports current alcohol use. She reports that she does not use drugs.  Allergies:  Allergies  Allergen Reactions   Penicillins Swelling    facilal    Medications: I have reviewed the patient's current medications.  Results for orders placed or performed during the hospital encounter of 03/21/22 (from the past 48 hour(s))  Lipase, blood     Status: None   Collection Time: 03/21/22  4:47 AM  Result Value Ref Range   Lipase 16 11 - 51 U/L    Comment: Performed at [facility], [place], [place] [address]  Comprehensive metabolic panel     Status: Abnormal   Collection Time: 03/21/22  4:47 AM  Result Value Ref Range   Sodium 143 135 - 145 mmol/L   Potassium 3.9 3.5 - 5.1 mmol/L   Chloride 100 98 - 111 mmol/L   CO2 26 22 - 32 mmol/L   Glucose, Bld 163 (H) 70 - 99  mg/dL    Comment: Glucose reference range applies only to samples taken after fasting for at least 8 hours.   BUN 17 8 - 23 mg/dL   Creatinine, Ser 0.63 0.44 - 1.00 mg/dL   Calcium 9.5 8.9 - 10.3 mg/dL   Total Protein 7.1 6.5 - 8.1 g/dL   Albumin 3.9 3.5 - 5.0 g/dL   AST 16 15 - 41 U/L   ALT 14 0 - 44 U/L   Alkaline Phosphatase 78 38 - 126 U/L   Total Bilirubin 0.5 0.3 - 1.2 mg/dL   GFR, Estimated >60 >60 mL/min    Comment: (NOTE) Calculated using the CKD-EPI Creatinine Equation (2021)    Anion gap 17 (H) 5 - 15    Comment: Performed at [facility], [address] [place], [place] [address]  CBC     Status: Abnormal   Collection Time: 03/21/22  4:47 AM  Result Value Ref Range   WBC 9.0 4.0 - 10.5 K/uL   RBC 5.32 (H) 3.87 - 5.11 MIL/uL   Hemoglobin 15.0 12.0 - 15.0 g/dL   HCT 47.3 (H) 36.0 - 46.0 %   MCV 88.9 80.0 - 100.0 fL   MCH 28.2 26.0 - 34.0 pg   MCHC 31.7 30.0 - 36.0 g/dL   RDW 14.1  11.5 - 15.5 %   Platelets 394 150 - 400 K/uL   nRBC 0.0 0.0 - 0.2 %    Comment: Performed at [facility], [address], [place], [place] [address]  Troponin I (High Sensitivity)     Status: None   Collection Time: 03/21/22  4:47 AM  Result Value Ref Range   Troponin I (High Sensitivity) 6 <18 ng/L    Comment: (NOTE) Elevated high sensitivity troponin I (hsTnI) values and significant  changes across serial measurements may suggest ACS but many other  chronic and acute conditions are known to elevate hsTnI results.  Refer to the "Links" section for chest pain algorithms and additional  guidance. Performed at [facility], [address], [place], [place] [address]     DG Chest [place] 1 View  Result Date: 03/21/2022 CLINICAL DATA:  Nasogastric tube present. EXAM: PORTABLE CHEST 1 VIEW COMPARISON:  April 24, 2014 FINDINGS: The distal tip of the NG tube is in the gastric fundus/body. The side port is approximately 2 cm below  the GE junction. IMPRESSION: The side port and distal tip of the NG tube are in the stomach as above. Electronically Signed   By: Gerome Sam Wright M.D.   On: 03/21/2022 08:17   CT ABDOMEN PELVIS W CONTRAST  Result Date: 03/21/2022 CLINICAL DATA:  Acute, nonlocalized abdominal pain EXAM: CT ABDOMEN AND PELVIS WITH CONTRAST TECHNIQUE: Multidetector CT imaging of the abdomen and pelvis was performed using the standard protocol following bolus administration of intravenous contrast. RADIATION DOSE REDUCTION: This exam was performed according to the departmental dose-optimization program which includes automated exposure control, adjustment of the mA and/or kV according to patient size and/or use of iterative reconstruction technique. CONTRAST:  12mL OMNIPAQUE IOHEXOL 300 MG/ML  SOLN COMPARISON:  None Available. FINDINGS: Lower chest:  No acute finding Hepatobiliary: No focal liver abnormality.No evidence of biliary obstruction or stone. Pancreas: Unremarkable. Spleen: Subcentimeter low-density in the central spleen, non worrisome although nonspecific. Adrenals/Urinary Tract: Negative adrenals. No hydronephrosis or stone. Unremarkable bladder. Stomach/Bowel: Dilated and fluid-filled small bowel loops with mesenteric congestion. Marked transition point in the right lower quadrant with very decompressed distal loops, presumed underlying adhesive disease. Multiple distal colonic diverticula. No primary inflammatory process is seen. Vascular/Lymphatic: Severe atheromatous calcification of the aorta and iliacs with diffuse highly stenotic iliacs and common femoral arteries. Major mesenteric vessels are enhancing although there is prominent plaque at the celiac and SMA origins. No mass or adenopathy. Reproductive:Hysterectomy Other: Small volume reactive ascites Musculoskeletal: Osteopenia and generalized degeneration. No acute or focal finding IMPRESSION: 1. High-grade small bowel obstruction with transition point in  the right lower quadrant. Associated mesenteric edema and trace ascites, no perforation. 2. Advanced atherosclerosis with high-grade bilateral inflow stenoses. Electronically Signed   By: Tiburcio Pea M.D.   On: 03/21/2022 06:24    Review of Systems  Constitutional: Negative.   HENT: Negative.    Eyes: Negative.   Respiratory: Negative.    Cardiovascular: Negative.   Gastrointestinal:  Positive for abdominal pain. Negative for nausea and vomiting.  Endocrine: Negative.   Genitourinary: Negative.   Musculoskeletal: Negative.   Skin: Negative.   Allergic/Immunologic: Negative.   Neurological: Negative.   Hematological: Negative.   Psychiatric/Behavioral: Negative.     Blood pressure (!) 177/71, pulse 68, temperature (!) 97.4 F (36.3 C), temperature source Axillary, resp. rate 12, height 5\' 1"  (1.549 m), weight 42.6 kg, SpO2 100 %. Physical Exam Vitals reviewed.  Constitutional:  General: She is not in acute distress.    Appearance: Normal appearance.  HENT:     Head: Normocephalic and atraumatic.     Right Ear: External ear normal.     Left Ear: External ear normal.     Nose: Nose normal.     Mouth/Throat:     Mouth: Mucous membranes are moist.     Pharynx: Oropharynx is clear.  Eyes:     General: No scleral icterus.    Extraocular Movements: Extraocular movements intact.     Conjunctiva/sclera: Conjunctivae normal.     Pupils: Pupils are equal, round, and reactive to light.  Cardiovascular:     Rate and Rhythm: Normal rate and regular rhythm.     Pulses: Normal pulses.     Heart sounds: Normal heart sounds.  Pulmonary:     Effort: Pulmonary effort is normal. No respiratory distress.     Breath sounds: Normal breath sounds.  Abdominal:     General: Abdomen is flat.     Palpations: Abdomen is soft.     Comments: There is moderate focal right sided tenderness  Musculoskeletal:        General: No swelling or deformity. Normal range of motion.     Cervical back:  Normal range of motion and neck supple.  Skin:    General: Skin is warm and dry.     Coloration: Skin is not jaundiced.  Neurological:     General: No focal deficit present.     Mental Status: She is alert and oriented to person, place, and time.  Psychiatric:        Mood and Affect: Mood normal.        Behavior: Behavior normal.     Assessment/Plan: The patient appears to have a small bowel obstruction.  She does have some focal right-sided tenderness but the left side is soft.  I have discussed with her and her family the different options for management which include bowel rest and decompression with the NG tube in the small bowel protocol versus surgery.  Surgery today would be higher risk because of her recent use of blood thinners.  The family believes that she is frail and may not tolerate surgery very well.  They would like to try nonoperative management first if possible and save surgery for if she does not improve.  I feel like this is a reasonable approach.  She does have an NG tube in.  We will start the small bowel protocol and monitor her closely.  We will hold her blood thinners.  Teresa Wright 03/21/2022, 11:20 AM

## 2022-03-21 NOTE — ED Notes (Signed)
43fr. NGT placed in right nare and secured with tape.Air bolus heard. Report given to oncoming RN, who ordered an xray for placement. Suction to low intermmitent suction.

## 2022-03-21 NOTE — ED Provider Notes (Signed)
[facility] Emergency Department Provider Note MRN:  [id number]  Arrival date & time: 03/21/22     Chief Complaint   Abdominal Pain   History of Present Illness   Teresa Wright is a 88 y.o. year-old female with a history of hypertension, CAD, COPD presenting to the ED with chief complaint of abdominal pain.  Severe abdominal pain with nausea and vomiting starting this evening.  No lower abdominal pain, no diarrhea.  Review of Systems  A thorough review of systems was obtained and all systems are negative except as noted in the HPI and PMH.   Patient's Health History    Past Medical History:  Diagnosis Date   COPD (chronic obstructive pulmonary disease) ([facility])    Hypertension    MI (myocardial infarction) ([facility])     Past Surgical History:  Procedure Laterality Date   ABDOMINAL HYSTERECTOMY     BREAST SURGERY      History reviewed. No pertinent family history.  Social History   Socioeconomic History   Marital status: Widowed    Spouse name: Not on file   Number of children: Not on file   Years of education: Not on file   Highest education level: Not on file  Occupational History   Not on file  Tobacco Use   Smoking status: Never   Smokeless tobacco: Never  Vaping Use   Vaping Use: Never used  Substance and Sexual Activity   Alcohol use: Yes    Comment: daily   Drug use: Never   Sexual activity: Not on file  Other Topics Concern   Not on file  Social History Narrative   Not on file   Social Determinants of Health   Financial Resource Strain: Not on file  Food Insecurity: Not on file  Transportation Needs: Not on file  Physical Activity: Not on file  Stress: Not on file  Social Connections: Not on file  Intimate Partner Violence: Not on file     Physical Exam   Vitals:   03/21/22 0622 03/21/22 0630  BP:  (!) 224/110  Pulse: 74 76  Resp: 13 16  Temp:    SpO2: 91% 94%    CONSTITUTIONAL: Ill-appearing, moderate  distress due to pain NEURO/PSYCH:  Alert and oriented x 3, no focal deficits EYES:  eyes equal and reactive ENT/NECK:  no LAD, no JVD CARDIO: Regular rate, well-perfused, normal S1 and S2 PULM:  CTAB no wheezing or rhonchi GI/GU: Moderately distended, diffuse tenderness MSK/SPINE:  No gross deformities, no edema SKIN:  no rash, atraumatic   *Additional and/or pertinent findings included in MDM below  Diagnostic and Interventional Summary    EKG Interpretation  Date/Time:  Sunday March 21 2022 04:47:44 EST Ventricular Rate:  60 PR Interval:  122 QRS Duration: 106 QT Interval:  467 QTC Calculation: 467 R Axis:   78 Text Interpretation: Sinus rhythm Borderline ST depression, diffuse leads Confirmed by Kennis Carina [id number]) on 03/21/2022 5:18:29 AM       Labs Reviewed  COMPREHENSIVE METABOLIC PANEL - Abnormal; Notable for the following components:      Result Value   Glucose, Bld 163 (*)    Anion gap 17 (*)    All other components within normal limits  CBC - Abnormal; Notable for the following components:   RBC 5.32 (*)    HCT 47.3 (*)    All other components within normal limits  LIPASE, BLOOD  URINALYSIS, ROUTINE W REFLEX MICROSCOPIC  TROPONIN I (HIGH SENSITIVITY)  TROPONIN I (HIGH SENSITIVITY)    CT ABDOMEN PELVIS W CONTRAST  Final Result      Medications  hydrALAZINE (APRESOLINE) injection 10 mg (has no administration in time range)  ondansetron (ZOFRAN) injection 4 mg (4 mg Intravenous Given 03/21/22 0503)  HYDROmorphone (DILAUDID) injection 0.5 mg (0.5 mg Intravenous Given 03/21/22 0501)  sodium chloride 0.9 % bolus 500 mL (0 mLs Intravenous Stopped 03/21/22 0549)  iohexol (OMNIPAQUE) 300 MG/ML solution 100 mL (80 mLs Intravenous Contrast Given 03/21/22 0511)  HYDROmorphone (DILAUDID) injection 1 mg (1 mg Intravenous Given 03/21/22 0548)     Procedures  /  Critical Care .Critical Care  Performed by: Maudie Flakes, MD Authorized by: Maudie Flakes, MD    Critical care provider statement:    Critical care time (minutes):  45   Critical care was necessary to treat or prevent imminent or life-threatening deterioration of the following conditions: Acute small bowel obstruction.   Critical care was time spent personally by me on the following activities:  Development of treatment plan with patient or surrogate, discussions with consultants, evaluation of patient's response to treatment, examination of patient, ordering and review of laboratory studies, ordering and review of radiographic studies, ordering and performing treatments and interventions, pulse oximetry, re-evaluation of patient's condition and review of old charts   ED Course and Medical Decision Making  Initial Impression and Ddx Concern for SBO versus perforated viscus versus cholecystitis versus pancreatitis  Past medical/surgical history that increases complexity of ED encounter: CAD, COPD  Interpretation of Diagnostics I personally reviewed the EKG and my interpretation is as follows: Sinus rhythm, nonspecific findings  Labs overall reassuring with no significant blood count or electrolyte disturbance.  CT reveals high-grade obstruction.  Patient Reassessment and Ultimate Disposition/Management     Placing NG tube, providing dose of hydralazine for blood pressure, continuing pain control.  Discussed case with Dr. Dema Severin, general surgery will follow in consultation, will admit to medicine.  Patient management required discussion with the following services or consulting groups:  Hospitalist Service and General/Trauma Surgery  Complexity of Problems Addressed Acute illness or injury that poses threat of life of bodily function  Additional Data Reviewed and Analyzed Further history obtained from: Further history from spouse/family member  Additional Factors Impacting ED Encounter Risk Use of parenteral controlled substances and Consideration of hospitalization  Barth Kirks.  Sedonia Small, Stella [email or web address]  Final Clinical Impressions(s) / ED Diagnoses     ICD-10-CM   1. SBO (small bowel obstruction) ([place])  K56.609       ED Discharge Orders     None        Discharge Instructions Discussed with and Provided to Patient:   Discharge Instructions   None      Maudie Flakes, MD 03/21/22 [phone]

## 2022-03-22 ENCOUNTER — Inpatient Hospital Stay (HOSPITAL_COMMUNITY): Payer: Medicare Other

## 2022-03-22 ENCOUNTER — Inpatient Hospital Stay (HOSPITAL_COMMUNITY): Payer: Medicare Other | Admitting: Certified Registered Nurse Anesthetist

## 2022-03-22 ENCOUNTER — Encounter (HOSPITAL_COMMUNITY): Payer: Self-pay | Admitting: Internal Medicine

## 2022-03-22 ENCOUNTER — Encounter (HOSPITAL_COMMUNITY): Admission: EM | Disposition: E | Payer: Self-pay | Source: Home / Self Care | Attending: Student

## 2022-03-22 ENCOUNTER — Other Ambulatory Visit: Payer: Self-pay

## 2022-03-22 DIAGNOSIS — E872 Acidosis, unspecified: Secondary | ICD-10-CM | POA: Diagnosis not present

## 2022-03-22 DIAGNOSIS — A419 Sepsis, unspecified organism: Secondary | ICD-10-CM

## 2022-03-22 DIAGNOSIS — J9601 Acute respiratory failure with hypoxia: Secondary | ICD-10-CM | POA: Diagnosis not present

## 2022-03-22 DIAGNOSIS — N179 Acute kidney failure, unspecified: Secondary | ICD-10-CM | POA: Insufficient documentation

## 2022-03-22 DIAGNOSIS — I252 Old myocardial infarction: Secondary | ICD-10-CM | POA: Diagnosis not present

## 2022-03-22 DIAGNOSIS — K56609 Unspecified intestinal obstruction, unspecified as to partial versus complete obstruction: Secondary | ICD-10-CM | POA: Diagnosis not present

## 2022-03-22 DIAGNOSIS — I2510 Atherosclerotic heart disease of native coronary artery without angina pectoris: Secondary | ICD-10-CM | POA: Diagnosis not present

## 2022-03-22 DIAGNOSIS — K55029 Acute infarction of small intestine, extent unspecified: Secondary | ICD-10-CM | POA: Diagnosis not present

## 2022-03-22 DIAGNOSIS — I10 Essential (primary) hypertension: Secondary | ICD-10-CM

## 2022-03-22 HISTORY — PX: BOWEL RESECTION: SHX1257

## 2022-03-22 HISTORY — PX: LAPAROTOMY: SHX154

## 2022-03-22 HISTORY — PX: LYSIS OF ADHESION: SHX5961

## 2022-03-22 LAB — LACTIC ACID, PLASMA
Lactic Acid, Venous: >9 mmol/L (ref 0.5–1.9)
Lactic Acid, Venous: >9 mmol/L (ref 0.5–1.9)
Lactic Acid, Venous: 5.9 mmol/L (ref 0.5–1.9)
Lactic Acid, Venous: 5.8 mmol/L (ref 0.5–1.9)

## 2022-03-22 LAB — GLUCOSE, CAPILLARY
Glucose-Capillary: 102 mg/dL — ABNORMAL HIGH (ref 70–99)
Glucose-Capillary: 223 mg/dL — ABNORMAL HIGH (ref 70–99)
Glucose-Capillary: 217 mg/dL — ABNORMAL HIGH (ref 70–99)
Glucose-Capillary: 173 mg/dL — ABNORMAL HIGH (ref 70–99)
Glucose-Capillary: 177 mg/dL — ABNORMAL HIGH (ref 70–99)

## 2022-03-22 LAB — CBC
HCT: 54 % — ABNORMAL HIGH (ref 36.0–46.0)
HCT: 36.6 % (ref 36.0–46.0)
HCT: 31.1 % — ABNORMAL LOW (ref 36.0–46.0)
Hemoglobin: 15.6 g/dL — ABNORMAL HIGH (ref 12.0–15.0)
Hemoglobin: 10.8 g/dL — ABNORMAL LOW (ref 12.0–15.0)
Hemoglobin: 9.6 g/dL — ABNORMAL LOW (ref 12.0–15.0)
MCH: 28.2 pg (ref 26.0–34.0)
MCH: 28.6 pg (ref 26.0–34.0)
MCH: 28.4 pg (ref 26.0–34.0)
MCHC: 28.9 g/dL — ABNORMAL LOW (ref 30.0–36.0)
MCHC: 29.5 g/dL — ABNORMAL LOW (ref 30.0–36.0)
MCHC: 30.9 g/dL (ref 30.0–36.0)
MCV: 97.5 fL (ref 80.0–100.0)
MCV: 97.1 fL (ref 80.0–100.0)
MCV: 92 fL (ref 80.0–100.0)
Platelets: 494 10*3/uL — ABNORMAL HIGH (ref 150–400)
Platelets: 198 10*3/uL (ref 150–400)
Platelets: 190 10*3/uL (ref 150–400)
RBC: 5.54 MIL/uL — ABNORMAL HIGH (ref 3.87–5.11)
RBC: 3.77 MIL/uL — ABNORMAL LOW (ref 3.87–5.11)
RBC: 3.38 MIL/uL — ABNORMAL LOW (ref 3.87–5.11)
RDW: 14.6 % (ref 11.5–15.5)
RDW: 14.6 % (ref 11.5–15.5)
RDW: 14.6 % (ref 11.5–15.5)
WBC: 17.4 10*3/uL — ABNORMAL HIGH (ref 4.0–10.5)
WBC: 16 10*3/uL — ABNORMAL HIGH (ref 4.0–10.5)
WBC: 12.8 10*3/uL — ABNORMAL HIGH (ref 4.0–10.5)
nRBC: 0.1 % (ref 0.0–0.2)
nRBC: 0.1 % (ref 0.0–0.2)
nRBC: 0 % (ref 0.0–0.2)

## 2022-03-22 LAB — COMPREHENSIVE METABOLIC PANEL
ALT: <5 U/L (ref 0–44)
ALT: 1131 U/L — ABNORMAL HIGH (ref 0–44)
ALT: 1800 U/L — ABNORMAL HIGH (ref 0–44)
AST: 31 U/L (ref 15–41)
AST: 1262 U/L — ABNORMAL HIGH (ref 15–41)
AST: 1783 U/L — ABNORMAL HIGH (ref 15–41)
Albumin: 2.5 g/dL — ABNORMAL LOW (ref 3.5–5.0)
Albumin: 2.1 g/dL — ABNORMAL LOW (ref 3.5–5.0)
Albumin: 2.3 g/dL — ABNORMAL LOW (ref 3.5–5.0)
Alkaline Phosphatase: 57 U/L (ref 38–126)
Alkaline Phosphatase: 40 U/L (ref 38–126)
Alkaline Phosphatase: 40 U/L (ref 38–126)
Anion gap: 17 — ABNORMAL HIGH (ref 5–15)
Anion gap: 20 — ABNORMAL HIGH (ref 5–15)
Anion gap: 12 (ref 5–15)
BUN: 22 mg/dL (ref 8–23)
BUN: 21 mg/dL (ref 8–23)
BUN: 28 mg/dL — ABNORMAL HIGH (ref 8–23)
CO2: 12 mmol/L — ABNORMAL LOW (ref 22–32)
CO2: 11 mmol/L — ABNORMAL LOW (ref 22–32)
CO2: 21 mmol/L — ABNORMAL LOW (ref 22–32)
Calcium: 7.6 mg/dL — ABNORMAL LOW (ref 8.9–10.3)
Calcium: 6.6 mg/dL — ABNORMAL LOW (ref 8.9–10.3)
Calcium: 6.8 mg/dL — ABNORMAL LOW (ref 8.9–10.3)
Chloride: 115 mmol/L — ABNORMAL HIGH (ref 98–111)
Chloride: 111 mmol/L (ref 98–111)
Chloride: 110 mmol/L (ref 98–111)
Creatinine, Ser: 1.95 mg/dL — ABNORMAL HIGH (ref 0.44–1.00)
Creatinine, Ser: 1.84 mg/dL — ABNORMAL HIGH (ref 0.44–1.00)
Creatinine, Ser: 1.79 mg/dL — ABNORMAL HIGH (ref 0.44–1.00)
GFR, Estimated: 25 mL/min — ABNORMAL LOW (ref >60)
GFR, Estimated: 27 mL/min — ABNORMAL LOW (ref >60)
GFR, Estimated: 27 mL/min — ABNORMAL LOW (ref >60)
Glucose, Bld: 192 mg/dL — ABNORMAL HIGH (ref 70–99)
Glucose, Bld: 132 mg/dL — ABNORMAL HIGH (ref 70–99)
Glucose, Bld: 244 mg/dL — ABNORMAL HIGH (ref 70–99)
Potassium: 4.1 mmol/L (ref 3.5–5.1)
Potassium: 5.3 mmol/L — ABNORMAL HIGH (ref 3.5–5.1)
Potassium: 3.8 mmol/L (ref 3.5–5.1)
Sodium: 144 mmol/L (ref 135–145)
Sodium: 142 mmol/L (ref 135–145)
Sodium: 143 mmol/L (ref 135–145)
Total Bilirubin: 0.3 mg/dL (ref 0.3–1.2)
Total Bilirubin: 0.5 mg/dL (ref 0.3–1.2)
Total Bilirubin: 0.6 mg/dL (ref 0.3–1.2)
Total Protein: 5.1 g/dL — ABNORMAL LOW (ref 6.5–8.1)
Total Protein: 3.7 g/dL — ABNORMAL LOW (ref 6.5–8.1)
Total Protein: 3.9 g/dL — ABNORMAL LOW (ref 6.5–8.1)

## 2022-03-22 LAB — URINALYSIS, ROUTINE W REFLEX MICROSCOPIC
Bacteria, UA: NONE SEEN
Bilirubin Urine: NEGATIVE
Glucose, UA: 50 mg/dL — AB
Ketones, ur: NEGATIVE mg/dL
Leukocytes,Ua: NEGATIVE
Nitrite: NEGATIVE
Protein, ur: 100 mg/dL — AB
Specific Gravity, Urine: 1.018 (ref 1.005–1.030)
pH: 5 (ref 5.0–8.0)

## 2022-03-22 LAB — SODIUM, URINE, RANDOM: Sodium, Ur: 42 mmol/L

## 2022-03-22 LAB — BLOOD GAS, ARTERIAL
Acid-base deficit: 15 mmol/L — ABNORMAL HIGH (ref 0.0–2.0)
Bicarbonate: 12.3 mmol/L — ABNORMAL LOW (ref 20.0–28.0)
Drawn by: 275531
FIO2: 100 %
MECHVT: 360 mL
O2 Saturation: 100 %
PEEP: 5 cmH2O
Patient temperature: 32.3
RATE: 20 resp/min
pCO2 arterial: 27 mmHg — ABNORMAL LOW (ref 32–48)
pH, Arterial: 7.24 — ABNORMAL LOW (ref 7.35–7.45)
pO2, Arterial: 234 mmHg — ABNORMAL HIGH (ref 83–108)

## 2022-03-22 LAB — MAGNESIUM: Magnesium: 1.3 mg/dL — ABNORMAL LOW (ref 1.7–2.4)

## 2022-03-22 LAB — MRSA NEXT GEN BY PCR, NASAL: MRSA by PCR Next Gen: NOT DETECTED

## 2022-03-22 LAB — PROTIME-INR
INR: 2.9 — ABNORMAL HIGH (ref 0.8–1.2)
Prothrombin Time: 29.9 seconds — ABNORMAL HIGH (ref 11.4–15.2)

## 2022-03-22 LAB — HEMOGLOBIN A1C
Hgb A1c MFr Bld: 6.3 % — ABNORMAL HIGH (ref 4.8–5.6)
Mean Plasma Glucose: 134 mg/dL

## 2022-03-22 LAB — PHOSPHORUS: Phosphorus: 9.3 mg/dL — ABNORMAL HIGH (ref 2.5–4.6)

## 2022-03-22 LAB — CREATININE, URINE, RANDOM: Creatinine, Urine: 39 mg/dL

## 2022-03-22 SURGERY — LAPAROTOMY, EXPLORATORY
Anesthesia: General | Site: Abdomen

## 2022-03-22 MED ORDER — DOCUSATE SODIUM 50 MG/5ML PO LIQD: 100.0000 mg | Freq: Two times a day (BID) | ORAL | Status: DC

## 2022-03-22 MED ORDER — REVEFENACIN 175 MCG/3ML IN SOLN
175.0000 ug | Freq: Every day | RESPIRATORY_TRACT | Status: DC
  Administered 2022-03-22 – 2022-03-25 (×4): 175 ug via RESPIRATORY_TRACT
  Filled 2022-03-22 (×4): qty 3

## 2022-03-22 MED ORDER — LACTATED RINGERS IV BOLUS
1000.0000 mL | Freq: Once | INTRAVENOUS | Status: AC
  Administered 2022-03-22: 1000 mL via INTRAVENOUS

## 2022-03-22 MED ORDER — FENTANYL CITRATE (PF) 100 MCG/2ML IJ SOLN
INTRAMUSCULAR | Status: DC | PRN
  Administered 2022-03-22: 50 ug via INTRAVENOUS

## 2022-03-22 MED ORDER — SODIUM BICARBONATE 8.4 % IV SOLN
INTRAVENOUS | Status: DC | PRN
  Administered 2022-03-22: 50 meq via INTRAVENOUS

## 2022-03-22 MED ORDER — MIDAZOLAM HCL 2 MG/2ML IJ SOLN
1.0000 mg | INTRAMUSCULAR | Status: DC | PRN
  Administered 2022-03-22: 2 mg via INTRAVENOUS
  Administered 2022-03-22: 1 mg via INTRAVENOUS
  Administered 2022-03-23: 2 mg via INTRAVENOUS
  Filled 2022-03-22 (×3): qty 2

## 2022-03-22 MED ORDER — CIPROFLOXACIN IN D5W 400 MG/200ML IV SOLN
INTRAVENOUS | Status: AC
  Filled 2022-03-22: qty 200

## 2022-03-22 MED ORDER — PHENYLEPHRINE HCL-NACL 20-0.9 MG/250ML-% IV SOLN
INTRAVENOUS | Status: AC
  Filled 2022-03-22: qty 250

## 2022-03-22 MED ORDER — FENTANYL CITRATE (PF) 250 MCG/5ML IJ SOLN
INTRAMUSCULAR | Status: AC
  Filled 2022-03-22: qty 5

## 2022-03-22 MED ORDER — SUCCINYLCHOLINE CHLORIDE 200 MG/10ML IV SOSY
PREFILLED_SYRINGE | INTRAVENOUS | Status: DC | PRN
  Administered 2022-03-22: 100 mg via INTRAVENOUS

## 2022-03-22 MED ORDER — CHLORHEXIDINE GLUCONATE CLOTH 2 % EX PADS
6.0000 | MEDICATED_PAD | Freq: Every day | CUTANEOUS | Status: DC
  Administered 2022-03-22 – 2022-03-25 (×4): 6 via TOPICAL

## 2022-03-22 MED ORDER — SODIUM BICARBONATE 8.4 % IV SOLN
50.0000 meq | Freq: Once | INTRAVENOUS | Status: AC
  Administered 2022-03-22: 50 meq via INTRAVENOUS
  Filled 2022-03-22: qty 50

## 2022-03-22 MED ORDER — CALCIUM GLUCONATE-NACL 1-0.675 GM/50ML-% IV SOLN
1.0000 g | Freq: Once | INTRAVENOUS | Status: AC
  Administered 2022-03-22: 1000 mg via INTRAVENOUS
  Filled 2022-03-22: qty 50

## 2022-03-22 MED ORDER — FENTANYL CITRATE PF 50 MCG/ML IJ SOSY
25.0000 ug | PREFILLED_SYRINGE | INTRAMUSCULAR | Status: DC | PRN
  Administered 2022-03-22 – 2022-03-23 (×3): 50 ug via INTRAVENOUS
  Filled 2022-03-22 (×3): qty 1

## 2022-03-22 MED ORDER — FENTANYL CITRATE PF 50 MCG/ML IJ SOSY
12.5000 ug | PREFILLED_SYRINGE | Freq: Once | INTRAMUSCULAR | Status: AC
  Administered 2022-03-22: 12.5 ug via INTRAVENOUS
  Filled 2022-03-22: qty 1

## 2022-03-22 MED ORDER — SODIUM CHLORIDE 0.9 % IV BOLUS
500.0000 mL | Freq: Once | INTRAVENOUS | Status: AC
  Administered 2022-03-22: 500 mL via INTRAVENOUS

## 2022-03-22 MED ORDER — SODIUM CHLORIDE 0.9 % IV SOLN
2.0000 g | INTRAVENOUS | Status: DC
  Administered 2022-03-22 – 2022-03-23 (×2): 2 g via INTRAVENOUS
  Filled 2022-03-22 (×2): qty 12.5

## 2022-03-22 MED ORDER — SODIUM BICARBONATE 8.4 % IV SOLN
INTRAVENOUS | Status: AC
  Filled 2022-03-22: qty 50

## 2022-03-22 MED ORDER — ETOMIDATE 2 MG/ML IV SOLN
INTRAVENOUS | Status: DC | PRN
  Administered 2022-03-22: 10 mg via INTRAVENOUS

## 2022-03-22 MED ORDER — ALBUMIN HUMAN 25 % IV SOLN
12.5000 g | Freq: Once | INTRAVENOUS | Status: AC
  Administered 2022-03-22: 12.5 g via INTRAVENOUS
  Filled 2022-03-22: qty 50

## 2022-03-22 MED ORDER — ARFORMOTEROL TARTRATE 15 MCG/2ML IN NEBU
15.0000 ug | INHALATION_SOLUTION | Freq: Two times a day (BID) | RESPIRATORY_TRACT | Status: DC
  Administered 2022-03-22 – 2022-03-25 (×7): 15 ug via RESPIRATORY_TRACT
  Filled 2022-03-22 (×7): qty 2

## 2022-03-22 MED ORDER — ORAL CARE MOUTH RINSE: 15.0000 mL | OROMUCOSAL | Status: DC | PRN

## 2022-03-22 MED ORDER — MAGNESIUM SULFATE 2 GM/50ML IV SOLN
2.0000 g | Freq: Once | INTRAVENOUS | Status: AC
  Administered 2022-03-22: 2 g via INTRAVENOUS
  Filled 2022-03-22: qty 50

## 2022-03-22 MED ORDER — BUDESONIDE 0.5 MG/2ML IN SUSP
0.5000 mg | Freq: Two times a day (BID) | RESPIRATORY_TRACT | Status: DC
  Administered 2022-03-22 – 2022-03-25 (×7): 0.5 mg via RESPIRATORY_TRACT
  Filled 2022-03-22 (×7): qty 2

## 2022-03-22 MED ORDER — POLYETHYLENE GLYCOL 3350 17 G PO PACK: 17.0000 g | PACK | Freq: Every day | ORAL | Status: DC

## 2022-03-22 MED ORDER — ONDANSETRON HCL 4 MG/2ML IJ SOLN
INTRAMUSCULAR | Status: DC | PRN
  Administered 2022-03-22: 4 mg via INTRAVENOUS

## 2022-03-22 MED ORDER — METRONIDAZOLE 500 MG/100ML IV SOLN
500.0000 mg | Freq: Two times a day (BID) | INTRAVENOUS | Status: DC
  Administered 2022-03-22 – 2022-03-25 (×7): 500 mg via INTRAVENOUS
  Filled 2022-03-22 (×7): qty 100

## 2022-03-22 MED ORDER — DEXMEDETOMIDINE HCL IN NACL 200 MCG/50ML IV SOLN
0.0000 ug/kg/h | INTRAVENOUS | Status: DC
  Administered 2022-03-22: 0.4 ug/kg/h via INTRAVENOUS
  Administered 2022-03-22 – 2022-03-23 (×2): 0.5 ug/kg/h via INTRAVENOUS
  Filled 2022-03-22 (×3): qty 50

## 2022-03-22 MED ORDER — ORAL CARE MOUTH RINSE
15.0000 mL | OROMUCOSAL | Status: DC
  Administered 2022-03-22 – 2022-03-23 (×17): 15 mL via OROMUCOSAL

## 2022-03-22 MED ORDER — INSULIN ASPART 100 UNIT/ML IJ SOLN
0.0000 [IU] | INTRAMUSCULAR | Status: DC
  Administered 2022-03-22: 3 [IU] via SUBCUTANEOUS
  Administered 2022-03-22 (×2): 2 [IU] via SUBCUTANEOUS
  Administered 2022-03-22: 3 [IU] via SUBCUTANEOUS
  Administered 2022-03-23: 2 [IU] via SUBCUTANEOUS
  Administered 2022-03-23: 1 [IU] via SUBCUTANEOUS
  Administered 2022-03-23: 2 [IU] via SUBCUTANEOUS

## 2022-03-22 MED ORDER — VASOPRESSIN 20 UNIT/ML IV SOLN
INTRAVENOUS | Status: AC
  Filled 2022-03-22: qty 1

## 2022-03-22 MED ORDER — PANTOPRAZOLE SODIUM 40 MG IV SOLR
40.0000 mg | INTRAVENOUS | Status: DC
  Administered 2022-03-22: 40 mg via INTRAVENOUS
  Filled 2022-03-22: qty 10

## 2022-03-22 MED ORDER — ALBUMIN HUMAN 5 % IV SOLN: INTRAVENOUS | Status: DC | PRN

## 2022-03-22 MED ORDER — LACTATED RINGERS IV SOLN: INTRAVENOUS | Status: DC

## 2022-03-22 MED ORDER — SODIUM BICARBONATE 8.4 % IV SOLN
INTRAVENOUS | Status: DC
  Filled 2022-03-22 (×2): qty 150
  Filled 2022-03-22: qty 1000
  Filled 2022-03-22 (×2): qty 150

## 2022-03-22 MED ORDER — CIPROFLOXACIN IN D5W 200 MG/100ML IV SOLN
200.0000 mg | Freq: Once | INTRAVENOUS | Status: DC
  Filled 2022-03-22: qty 100

## 2022-03-22 MED ORDER — FENTANYL CITRATE PF 50 MCG/ML IJ SOSY
25.0000 ug | PREFILLED_SYRINGE | INTRAMUSCULAR | Status: DC | PRN
  Administered 2022-03-22: 25 ug via INTRAVENOUS
  Filled 2022-03-22: qty 1

## 2022-03-22 MED ORDER — ROCURONIUM BROMIDE 10 MG/ML (PF) SYRINGE
PREFILLED_SYRINGE | INTRAVENOUS | Status: DC | PRN
  Administered 2022-03-22: 50 mg via INTRAVENOUS

## 2022-03-22 MED ORDER — PANTOPRAZOLE SODIUM 40 MG IV SOLR
40.0000 mg | Freq: Two times a day (BID) | INTRAVENOUS | Status: DC
  Administered 2022-03-22 – 2022-03-25 (×6): 40 mg via INTRAVENOUS
  Filled 2022-03-22 (×6): qty 10

## 2022-03-22 MED ORDER — SODIUM CHLORIDE 0.9 % IR SOLN
Status: DC | PRN
  Administered 2022-03-22: 3000 mL

## 2022-03-22 MED ORDER — PIPERACILLIN-TAZOBACTAM 3.375 G IVPB
INTRAVENOUS | Status: AC
  Filled 2022-03-22: qty 50

## 2022-03-22 MED ORDER — DEXAMETHASONE SODIUM PHOSPHATE 10 MG/ML IJ SOLN
INTRAMUSCULAR | Status: DC | PRN
  Administered 2022-03-22: 10 mg via INTRAVENOUS

## 2022-03-22 MED ORDER — CIPROFLOXACIN IN D5W 400 MG/200ML IV SOLN
INTRAVENOUS | Status: DC | PRN
  Administered 2022-03-22: 400 mg via INTRAVENOUS

## 2022-03-22 MED ORDER — SODIUM CHLORIDE 0.9 % IV SOLN: INTRAVENOUS | Status: DC | PRN

## 2022-03-22 MED ORDER — VASOPRESSIN 20 UNIT/ML IV SOLN
INTRAVENOUS | Status: DC | PRN
  Administered 2022-03-22 (×2): 1 [IU] via INTRAVENOUS

## 2022-03-22 SURGICAL SUPPLY — 46 items
APPLICATOR COTTON TIP 6 STRL (MISCELLANEOUS) ×1 IMPLANT
APPLICATOR COTTON TIP 6IN STRL (MISCELLANEOUS) ×1 IMPLANT
BAG COUNTER SPONGE SURGICOUNT (BAG) IMPLANT
BLADE EXTENDED COATED 6.5IN (ELECTRODE) IMPLANT
BLADE HEX COATED 2.75 (ELECTRODE) ×1 IMPLANT
BNDG GAUZE DERMACEA FLUFF 4 (GAUZE/BANDAGES/DRESSINGS) IMPLANT
CHLORAPREP W/TINT 26 (MISCELLANEOUS) ×1 IMPLANT
COVER MAYO STAND STRL (DRAPES) IMPLANT
COVER SURGICAL LIGHT HANDLE (MISCELLANEOUS) ×1 IMPLANT
DRAPE LAPAROSCOPIC ABDOMINAL (DRAPES) ×1 IMPLANT
DRAPE WARM FLUID 44X44 (DRAPES) IMPLANT
ELECT REM PT RETURN 15FT ADLT (MISCELLANEOUS) ×1 IMPLANT
GAUZE PAD ABD 8X10 STRL (GAUZE/BANDAGES/DRESSINGS) IMPLANT
GAUZE SPONGE 4X4 12PLY STRL (GAUZE/BANDAGES/DRESSINGS) ×1 IMPLANT
GLOVE BIO SURGEON STRL SZ7.5 (GLOVE) ×2 IMPLANT
GLOVE BIO SURGEON STRL SZ8 (GLOVE) IMPLANT
GLOVE BIOGEL PI IND STRL 7.0 (GLOVE) ×1 IMPLANT
GLOVE BIOGEL PI IND STRL 7.5 (GLOVE) IMPLANT
GLOVE BIOGEL PI IND STRL 8 (GLOVE) IMPLANT
GOWN STRL REUS W/ TWL LRG LVL3 (GOWN DISPOSABLE) ×1 IMPLANT
GOWN STRL REUS W/ TWL XL LVL3 (GOWN DISPOSABLE) ×1 IMPLANT
GOWN STRL REUS W/TWL LRG LVL3 (GOWN DISPOSABLE) ×1
GOWN STRL REUS W/TWL XL LVL3 (GOWN DISPOSABLE) ×3
HANDLE SUCTION POOLE (INSTRUMENTS) IMPLANT
KIT BASIN OR (CUSTOM PROCEDURE TRAY) ×1 IMPLANT
KIT TURNOVER KIT A (KITS) IMPLANT
LIGASURE IMPACT 36 18CM CVD LR (INSTRUMENTS) IMPLANT
PACK GENERAL/GYN (CUSTOM PROCEDURE TRAY) ×1 IMPLANT
RELOAD PROXIMATE 75MM BLUE (ENDOMECHANICALS) ×2 IMPLANT
RELOAD STAPLE 75 3.8 BLU REG (ENDOMECHANICALS) IMPLANT
SPONGE T-LAP 18X18 ~~LOC~~+RFID (SPONGE) IMPLANT
STAPLER PROXIMATE 75MM BLUE (STAPLE) ×1 IMPLANT
STAPLER VISISTAT 35W (STAPLE) ×1 IMPLANT
SUCTION POOLE HANDLE (INSTRUMENTS) ×1
SUT PDS AB 1 TP1 96 (SUTURE) IMPLANT
SUT SILK 2 0 (SUTURE)
SUT SILK 2 0 SH CR/8 (SUTURE) IMPLANT
SUT SILK 2-0 18XBRD TIE 12 (SUTURE) IMPLANT
SUT SILK 3 0 (SUTURE)
SUT SILK 3 0 SH CR/8 (SUTURE) IMPLANT
SUT SILK 3-0 18XBRD TIE 12 (SUTURE) IMPLANT
TAPE CLOTH SURG 6X10 WHT LF (GAUZE/BANDAGES/DRESSINGS) IMPLANT
TOWEL OR 17X26 10 PK STRL BLUE (TOWEL DISPOSABLE) ×2 IMPLANT
TRAY FOLEY MTR SLVR 14FR STAT (SET/KITS/TRAYS/PACK) IMPLANT
WATER STERILE IRR 1000ML POUR (IV SOLUTION) ×1 IMPLANT
YANKAUER SUCT BULB TIP NO VENT (SUCTIONS) IMPLANT

## 2022-03-22 NOTE — Progress Notes (Addendum)
       Overnight   NAME: Teresa Wright MRN: [id number] DOB : 14-Dec-1934    Date of Service   03/22/2022   HPI/Events of Note   Brief Hx of admission  88 year old female with medical history significant of HTN, CAD, COPD. Presents to ER with abdominal pain. She did not originally have fever or diarrhea. Her pain worsened to include flanks at which time she decided to come to the ER.  RN had advanced NG tube as recommended by earlier imaging, with obvious return of brown fluid from NG tube.  KUB was unavailable due to acuity and arrival of surgeon with patient transferring to OR. CT was canceled due to imminent arrival of surgeon.  Bedside visit yielded firm abdomen, patient expressing increase of pain from earlier. Obvious guarding. Patient is awake and oriented x 4 person, place, month and year. Notified by RN for continued hypotension, increased pain in abdomen, to include right and left flanks. Patient hypotensive despite 1.5 L additional fluid. Blood pressure 75 SBP.  Notified Dr. Marlou Starks of situation, also spoke to patient's daughter Erasmo Downer and son Gwyndolyn Saxon who both agreed to surgery. Cyril Mourning has arrived at facility and spoken to Psychologist, sport and exercise. Consent obtained.     Interventions/ Plan   Patient will transfer to surgical area. ICU bed made available. Potential need for ventilator post-op. The remainder of the immediate time period will be guided by surgeon/surgical team.      Gershon Cull BSN MSNA MSN [facility] Nurse Practitioner [place]

## 2022-03-22 NOTE — Progress Notes (Signed)
Patient ID: Teresa Wright, female   DOB: 1934/12/09, 88 y.o.   MRN: [id number]  The patients pain has increased during the night and her pain has worsened. We believe she will need urgent surgery tonight. I have discussed with her the risks and benefits of the surgery as well as some of the technical aspects including the possibility of staying intubated and she understands and wishes to proceed.

## 2022-03-22 NOTE — Progress Notes (Addendum)
[place] Progress Note Patient Name: Teresa Wright DOB: 1934-12-30 MRN: [id number]   Date of Service  03/22/2022  HPI/Events of Note  Received request for restraints Patient seen on heated HFNC and a risk for self harm by pulling lines and tubes  eICU Interventions  Bilateral soft wrist restraints renewed Bedside team to assess in am if restraints to be continued     Intervention Category Intermediate Interventions: Other:  Judd Lien 03/22/2022, 11:03 PM

## 2022-03-22 NOTE — Consult Note (Addendum)
NAME:  Teresa Wright, MRN:  [id number], DOB:  04-Apr-1934, LOS: 1 ADMISSION DATE:  03/21/2022, CONSULTATION DATE:  1/8 REFERRING MD:  Dr. Janee Morn TRH, CHIEF COMPLAINT:  SBO/post op vent.    History of Present Illness:  Patient is encephalopathic and/or intubated. Therefore history has been obtained from chart review. 88 year old female with PMH as below, which is significant for COPD, HTN, CAD. She is visiting from [place] and presented to [facility] ED 1/7 with complaints of abdominal pain and vomiting. CT consistent with SBO and transition point in the RLQ. She was admitted to the hospitalist service for NGT decompression and bowel rest. In the early AM hours of 1/8 she had a significant worsening of pain and became hypotensive. She was taken emergently to the OR by Dr. Carolynne Edouard. She underwent exploratory laparotomy, which discovered an area of ischemic small bowel, which was resected. Bowel to bowel anastomosis was created and her abdomen was closed. She was slow to arouse post-operatively so she remained on the vent and she was transferred to ICU. PCCM consulted by [facility] for vent care.   Pertinent  Medical History   has a past medical history of COPD (chronic obstructive pulmonary disease) (HCC), Hypertension, and MI (myocardial infarction) (HCC).   Significant Hospital Events: Including procedures, antibiotic start and stop dates in addition to other pertinent events   1/7 admit with SBO 1/8 taken emergently to OR.   Interim History / Subjective:    Objective   Blood pressure (!) 87/50, pulse 81, temperature (!) 97.5 F (36.4 C), resp. rate 20, height 5\' 1"  (1.549 m), weight 42.6 kg, SpO2 96 %.    Vent Mode: PRVC FiO2 (%):  [100 %] 100 % Set Rate:  [20 bmp] 20 bmp Vt Set:  [360 mL] 360 mL PEEP:  [5 cmH20] 5 cmH20 Plateau Pressure:  [12 cmH20] 12 cmH20   Intake/Output Summary (Last 24 hours) at 03/22/2022 0720 Last data filed at 03/22/2022 05/21/2022 Gross per 24 hour  Intake 1764.77  ml  Output 450 ml  Net 1314.77 ml   Filed Weights   03/21/22 0444  Weight: 42.6 kg    Examination: General: elderly female in NAD on vent HENT:[place]/AT, PERRL, no JVD Lungs: Clear bilateral breath sounds Cardiovascular: RRR, no MG Abdomen: Soft, surgical dressing in place, clean and dry. Non-distended.  Extremities: No acute deformity. Neuro: [facility] Problem list     Assessment & Plan:   Small bowel obstruction: small bowel ischemic now s/p resection of that area.  - NGT to LIS - Management per general surgery  - NPO - Start zosyn for intraabdominal infection  Acute hypoxemic respiratory failure: post op failure. Sats reading in the 80s on 100%.  - CXR now - ABG now - Full vent support - Wean FiO2 as tolerated - VAP bundle - PRN sedation for RASS goal 0 to -1 - Hopefully we can wean to extubate today.   Shock: secondary to bowel ischemia. Resolved post op.  - MIVF  Metabolic acidosis: likely lactic in the setting of shock and bowel ischemia although no lactic was checked.  - Repeat chemistry - Check lactic acid  AKI - Repeat chemistry - MIVF  COPD without exacerbation - Hold home trelegy - Budesonide, brovana, yupelri  CAD HTN - holding home losartan, metoprolol while NPO - PRN hydralazine  Hyperglycemia - CBG monitoring and SSI   Best Practice (right click and "Reselect all SmartList Selections" daily)   Diet/type: NPO DVT  prophylaxis: SCD plan to restart chemo ppx 1/9 if OK with surgery.  GI prophylaxis: PPI Lines: Arterial Line Foley:  Yes, and it is still needed Code Status:  DNR Last date of multidisciplinary goals of care discussion [ Discussed with daughter at bedside. Son in Ct is POA, but daughter has all documents here with her ]  Labs   CBC: Recent Labs  Lab 03/21/22 0447 03/22/22 0134  WBC 9.0 17.4*  HGB 15.0 15.6*  HCT 47.3* 54.0*  MCV 88.9 97.5  PLT 394 494*    Basic Metabolic Panel: Recent Labs  Lab  03/21/22 0447 03/22/22 0134  NA 143 144  K 3.9 4.1  CL 100 115*  CO2 26 12*  GLUCOSE 163* 192*  BUN 17 22  CREATININE 0.63 1.95*  CALCIUM 9.5 7.6*   GFR: Estimated Creatinine Clearance: 14.2 mL/min (A) (by C-G formula based on SCr of 1.95 mg/dL (H)). Recent Labs  Lab 03/21/22 0447 03/22/22 0134  WBC 9.0 17.4*    Liver Function Tests: Recent Labs  Lab 03/21/22 0447 03/22/22 0134  AST 16 31  ALT 14 <5  ALKPHOS 78 57  BILITOT 0.5 0.3  PROT 7.1 5.1*  ALBUMIN 3.9 2.5*   Recent Labs  Lab 03/21/22 0447  LIPASE 16   No results for input(s): "AMMONIA" in the last 168 hours.  ABG No results found for: "PHART", "PCO2ART", "PO2ART", "HCO3", "TCO2", "ACIDBASEDEF", "O2SAT"   Coagulation Profile: No results for input(s): "INR", "PROTIME" in the last 168 hours.  Cardiac Enzymes: No results for input(s): "CKTOTAL", "CKMB", "CKMBINDEX", "TROPONINI" in the last 168 hours.  HbA1C: No results found for: "HGBA1C"  CBG: No results for input(s): "GLUCAP" in the last 168 hours.  Review of Systems:   Patient is encephalopathic and/or intubated. Therefore history has been obtained from chart review.   Past Medical History:  She,  has a past medical history of COPD (chronic obstructive pulmonary disease) (HCC), Hypertension, and MI (myocardial infarction) (HCC).   Surgical History:   Past Surgical History:  Procedure Laterality Date   ABDOMINAL HYSTERECTOMY     BREAST SURGERY       Social History:   reports that she has never smoked. She has never used smokeless tobacco. She reports current alcohol use. She reports that she does not use drugs.   Family History:  Her family history is not on file.   Allergies Allergies  Allergen Reactions   Penicillins Swelling    facilal     Home Medications  Prior to Admission medications   Medication Sig Start Date End Date Taking? Authorizing Provider  aspirin EC 81 MG tablet Take 81 mg by mouth daily. Swallow whole.   Yes  [provider]  atorvastatin (LIPITOR) 40 MG tablet Take 40 mg by mouth See admin instructions. Give 1 tablet by mouth in the evening for Hyperlipidemia. 02/11/21  Yes [provider]  clopidogrel (PLAVIX) 75 MG tablet Take 75 mg by mouth See admin instructions. Give 1 tablet by mouth in the morning for Anticoagulant therapy. 02/11/21  Yes [provider]  fluticasone (FLONASE) 50 MCG/ACT nasal spray Place 1 spray into both nostrils See admin instructions. 1 spray in each nostril in the morning for Nasal agents. 02/11/21  Yes [provider]  ipratropium-albuterol (DUONEB) 0.5-2.5 (3) MG/3ML SOLN Inhale 3 mLs into the lungs See admin instructions. 3 ml inhale orally every 12 hours for SOB 02/10/21  Yes [provider]  losartan (COZAAR) 100 MG tablet Take 100  mg by mouth See admin instructions. Give 1 tablet by mouth in the morning for Hypertension. 02/11/21  Yes [provider]  LUMIGAN 0.01 % SOLN Place 1 drop into both eyes at bedtime. 01/25/22  Yes [provider]  metoprolol succinate (TOPROL-XL) 25 MG 24 hr tablet Take 25 mg by mouth See admin instructions. Give 1 tablet by mouth at bedtime for Hypertension. 02/10/21  Yes [provider]  montelukast (SINGULAIR) 10 MG tablet Take 10 mg by mouth See admin instructions. Give 1 tablet by mouth at bedtime for COPD. 02/10/21  Yes [provider]  nitroGLYCERIN (NITROSTAT) 0.4 MG SL tablet Place 0.4 mg under the tongue See admin instructions. Give 1 tablet sublingually every 5 minutes as needed for Chest pain. Take 1 tab up to 3 doses in 15 minutes, if chest pain persists, call MD and follow facility protocol. 02/10/21  Yes [provider]  sertraline (ZOLOFT) 50 MG tablet Take 50 mg by mouth See admin instructions. Give 1 tablet by mouth in the morning for Depression. 02/11/21  Yes [provider]  Donnal Debar 100-62.5-25 MCG/ACT AEPB Inhale 1 spray  into the lungs See admin instructions. 1 inhalation inhale orally in the morning for COPD. Rinse the mouth with water without swallowing to reduce the risk of oral thrush. 02/11/21  Yes [provider]     Critical care time: 39 minutes     Georgann Housekeeper, AGACNP-BC [facility]  See Amion for personal pager PCCM on call pager [phone] until 7pm. Please call Elink 7p-7a. [phone]  03/22/2022 7:44 AM

## 2022-03-22 NOTE — Progress Notes (Signed)
Pharmacy Antibiotic Note  Teresa Wright is a 88 y.o. female who presented to the ED on 03/21/2022 with c/o abdominal pain and n/v.  Abdominal CT on 03/21/22 showed SBO.  She underwent exp lap on 03/22/22 (noted to have necrotic small bowel in OR) with small bowel resection and lysis of adhesions.  Pharmacy has been consulted to dose cefepime for intra-abdominal infection.  Today, 03/22/2022: - Scr 1.84 (crcl~15), on bicarb drip - wbc 16  Plan: - cefepime 2gm IV q24h. PCN listed in allergy section.  Asked RN to monitor pt closely with first dose of cefepime. - flagyl 500 mg IV q12h - monitor renal function closely  ____________________________________  Height: 5\' 1"  (154.9 cm) Weight: 42.6 kg (94 lb) IBW/kg (Calculated) : 47.8  Temp (24hrs), Avg:97.3 F (36.3 C), Min:95.8 F (35.4 C), Max:97.9 F (36.6 C)  Recent Labs  Lab 03/21/22 0447 03/22/22 0134 03/22/22 0731  WBC 9.0 17.4* 16.0*  CREATININE 0.63 1.95* 1.84*  LATICACIDVEN  --   --  >9.0*    Estimated Creatinine Clearance: 15 mL/min (A) (by C-G formula based on SCr of 1.84 mg/dL (H)).    Allergies  Allergen Reactions   Penicillins Swelling    facilal     Thank you for allowing pharmacy to be a part of this patient's care.  Lynelle Doctor 03/22/2022 8:47 AM

## 2022-03-22 NOTE — Progress Notes (Signed)
PCCM INTERVAL PROGRESS NOTE   Patient more awake and following commands. Placed on 5/5 SBT and tolerating well. She is still quite somnolent for the most part. Discussed with family and we will plan to support her with the vent overnight and re-evaluate in the morning. There is also some reported history of daily alcohol use. Will start precedex to minimize the amount of versed she is getting and facilitate vent wean in the AM. RASS goal -1.      Georgann Housekeeper, AGACNP-BC [facility]  See Amion for personal pager PCCM on call pager [phone] until 7pm. Please call Elink 7p-7a. [phone]  03/22/2022 5:49 PM

## 2022-03-22 NOTE — Op Note (Signed)
03/21/2022 - 03/22/2022  6:03 AM  PATIENT:  Teresa Wright  88 y.o. female  PRE-OPERATIVE DIAGNOSIS:  small bowel obstruction  POST-OPERATIVE DIAGNOSIS:  small bowel obstruction with necrotic small bowel  PROCEDURE:  Procedure(s): EXPLORATORY LAPAROTOMY (N/A) SMALL BOWEL RESECTION (N/A) LYSIS OF ADHESION (N/A)  SURGEON:  Surgeon(s) and Role:    * Griselda Miner, MD - Primary  PHYSICIAN ASSISTANT:   ASSISTANTS: none   ANESTHESIA:   general  EBL:  minimal   BLOOD ADMINISTERED:none  DRAINS: none   LOCAL MEDICATIONS USED:  NONE  SPECIMEN:  Source of Specimen:  dead small bowel  DISPOSITION OF SPECIMEN:  PATHOLOGY  COUNTS:  YES  TOURNIQUET:  * No tourniquets in log *  DICTATION: .Dragon Dictation  After informed consent was obtained the patient was brought to the operating room and placed in the supine position on the operating table.  After adequate induction of general anesthesia the patient's abdomen was prepped with ChloraPrep, allowed to dry, and draped in usual sterile manner.  An appropriate timeout was performed.  A midline incision was made with a 10 blade knife.  The incision was carried through the skin and subcutaneous tissue sharply with the electrocautery until the linea alba was identified.  The linea alba was incised also with the electrocautery.  The preperitoneal space was then probed bluntly with a hemostat until the peritoneum was opened and access was gained to the abdominal cavity.  The rest of the incision was opened under direct vision.  There were omental adhesions to the anterior abdominal wall.  This was taken down sharply with the electrocautery and the LigaSure.  Once this was accomplished we were able to reflect the first colon superiorly.  I was able to run the small bowel.  I did not find a particular band but there was definitely a transition point along the mid small bowel between healthy-appearing small bowel and dead small bowel.  A site was  chosen above and below the necrotic section of small bowel for division of the small bowel.  The mesentery at each of the sites was opened sharply with the electrocautery.  The small bowel was then divided at each site with a single firing of a GIA 75 stapler.  The mesentery to the dead small bowel was taken down sharply with the LigaSure.  The small bowel was removed and sent to pathology for further evaluation.  The abdomen is then irrigated with copious amounts of saline.  The small bowel was then inspected again and the 2 ends proximally and distally both appeared to be healthy and viable.  I made a small opening on the antimesenteric side of each limb of small bowel near the staple line.  I placed each limb of a GIA-75 stapler down each limb of small bowel, clamped, and fired it thereby creating a nice widely patent enteroenterostomy.  The common opening was closed with a single firing of a TA 60 stapler.  The mesenteric defect was closed with interrupted 2-0 silk stitches.  The crotch was reinforced with an interrupted 2-0 silk stitch.  Once this was accomplished the anastomosis appeared viable and widely patent.  The small bowel was placed back into the abdominal cavity.  The NG tube was confirmed in the stomach.  There were no other abnormalities on general inspection of the abdominal cavity.  The fascia of the anterior abdominal wall was then closed with 2 running #1 double-stranded looped PDS sutures.  The subcutaneous tissue was packed  with moistened Kerlix gauze and sterile dressings were applied.  The patient tolerated the procedure well.  At the end of the case all needle sponge and instrument counts were correct.  At this point anesthesia is planning to try to extubate the patient and she will go to the ICU postoperatively.  PLAN OF CARE: Admit to inpatient   PATIENT DISPOSITION:  ICU - extubated and stable.   Delay start of Pharmacological VTE agent (>24hrs) due to surgical blood loss or risk  of bleeding: no

## 2022-03-22 NOTE — Transfer of Care (Signed)
Immediate Anesthesia Transfer of Care Note  Patient: Teresa Wright  Procedure(s) Performed: EXPLORATORY LAPAROTOMY (Abdomen) SMALL BOWEL RESECTION (Abdomen) LYSIS OF ADHESIONS (Abdomen)  Patient Location: ICU  Anesthesia Type:General  Level of Consciousness: Patient remains intubated per anesthesia plan  Airway & Oxygen Therapy: Patient placed on Ventilator (see vital sign flow sheet for setting)  Post-op Assessment: Report given to RN and Post -op Vital signs reviewed and stable  Post vital signs: Reviewed and stable  Last Vitals:  Vitals Value Taken Time  BP 152/40 03/22/22 0639  Temp    Pulse    Resp 20 03/22/22 0641  SpO2    Vitals shown include unvalidated device data.  Last Pain:  Vitals:   03/22/22 0329  TempSrc: Oral  PainSc:       Patients Stated Pain Goal: 3 (62/22/97 9892)  Complications: No notable events documented.

## 2022-03-22 NOTE — Anesthesia Procedure Notes (Signed)
Arterial Line Insertion Start/End1/10/2022 5:39 AM, 03/22/2022 5:45 AM Performed by: Barnet Glasgow, MD, anesthesiologist  Patient location: OR. Emergency situation Left, radial was placed Catheter size: 20 G Hand hygiene performed , maximum sterile barriers used  and Seldinger technique used  Attempts: 1 Procedure performed without using ultrasound guided technique. Following insertion, dressing applied. Post procedure assessment: normal  Patient tolerated the procedure well with no immediate complications.

## 2022-03-22 NOTE — Progress Notes (Signed)
Patient departed PACU with MDA and OR RN to OR Room 1 for procedure.

## 2022-03-22 NOTE — Progress Notes (Signed)
Patient arrived via RN and NT from progressive.  Jewelry and teeth removed and given to nurse to take back to daughter in waiting room.  Ring on right index finger will not come off and is taped over.  Patient BP 68/35.  Fluids wide open and changed to warm LR.  Will continue to monitor and notify for changes.

## 2022-03-22 NOTE — Anesthesia Procedure Notes (Signed)
Procedure Name: Intubation Date/Time: 03/22/2022 5:16 AM  Performed by: Gean Maidens, CRNAPre-anesthesia Checklist: Patient identified, Emergency Drugs available, Suction available, Patient being monitored and Timeout performed Patient Re-evaluated:Patient Re-evaluated prior to induction Oxygen Delivery Method: Circle system utilized Preoxygenation: Pre-oxygenation with 100% oxygen Induction Type: IV induction and Rapid sequence Laryngoscope Size: Mac and 4 Grade View: Grade I Tube type: Oral Tube size: 7.0 mm Number of attempts: 1 Airway Equipment and Method: Stylet Placement Confirmation: ETT inserted through vocal cords under direct vision, positive ETCO2 and breath sounds checked- equal and bilateral Secured at: 21 cm Tube secured with: Tape Dental Injury: Teeth and Oropharynx as per pre-operative assessment

## 2022-03-22 NOTE — Anesthesia Preprocedure Evaluation (Addendum)
Anesthesia Evaluation  Patient identified by MRN, date of birth, ID band Patient unresponsive    Reviewed: Allergy & Precautions, NPO status , Patient's Chart, lab work & pertinent test results  Airway Mallampati: II  TM Distance: >3 FB Neck ROM: Full    Dental no notable dental hx. (+) Teeth Intact, Dental Advisory Given   Pulmonary COPD   Pulmonary exam normal breath sounds clear to auscultation       Cardiovascular hypertension, + CAD and + Past MI  Normal cardiovascular exam Rhythm:Regular Rate:Normal     Neuro/Psych    GI/Hepatic negative GI ROS, Neg liver ROS,,,  Endo/Other    Renal/GU ARFRenal diseaseLab Results      Component                Value               Date                      CREATININE               1.95 (H)            03/22/2022                BUN                      22                  03/22/2022                NA                       144                 03/22/2022                K                        4.1                 03/22/2022                CL                       115 (H)             03/22/2022                CO2                      12 (L)              03/22/2022                Musculoskeletal   Abdominal   Peds  Hematology Lab Results      Component                Value               Date                      WBC                      17.4 (H)            03/22/2022  HGB                      15.6 (H)            03/22/2022                HCT                      54.0 (H)            03/22/2022                MCV                      97.5                03/22/2022                PLT                      494 (H)             03/22/2022              Anesthesia Other Findings   Reproductive/Obstetrics                             Anesthesia Physical Anesthesia Plan  ASA: 4 and emergent  Anesthesia Plan: General   Post-op Pain Management:  Ofirmev IV (intra-op)*   Induction: Intravenous  PONV Risk Score and Plan: 4 or greater and Treatment may vary due to age or medical condition and Ondansetron  Airway Management Planned: Oral ETT  Additional Equipment: Arterial line  Intra-op Plan:   Post-operative Plan: Possible Post-op intubation/ventilation  Informed Consent: I have reviewed the patients History and Physical, chart, labs and discussed the procedure including the risks, benefits and alternatives for the proposed anesthesia with the patient or authorized representative who has indicated his/her understanding and acceptance.   Patient has DNR.  Discussed DNR with power of attorney and Suspend DNR.   Dental advisory given  Plan Discussed with: CRNA, Anesthesiologist and Surgeon  Anesthesia Plan Comments: (Discussed DNR w Daughter. Pt would not want to be kept alive long term on machines. Will suspend DNR for procedure and immediate post op. Possibility of short term post op intubation)        Anesthesia Quick Evaluation

## 2022-03-22 NOTE — Progress Notes (Signed)
Initial Nutrition Assessment  DOCUMENTATION CODES:   Underweight  INTERVENTION:   -Recommend initiation of TPN given likely malnutrition  -Recommend monitor magnesium, potassium, and phosphorus BID for at least 3 days, MD to replete as needed, as pt is at risk for refeeding syndrome.   -Recommend vitamin supplementation of Thiamine and Folic acid given family reports of daily alcohol use.   NUTRITION DIAGNOSIS:   Inadequate oral intake related to inability to eat as evidenced by NPO status.  GOAL:   Patient will meet greater than or equal to 90% of their needs  MONITOR:   Vent status, Labs, Weight trends, I & O's  REASON FOR ASSESSMENT:   Ventilator    ASSESSMENT:   88 y.o. female with medical history significant of HTN, CAD, COPD. Presenting with abdominal pain. Admitted 1/7 with SBO taken for ex lap, LOA, small bowel resection/end to end anastomosis, brought to ICU post op on vent.  Patient is currently intubated on ventilator support MV: 6.9 L/min Temp (24hrs), Avg:96.1 F (35.6 C), Min:90.9 F (32.7 C), Max:98.1 F (36.7 C)  Patient on vent, family at bedside. Per daughter, pt lives on her own. Family lives in [place] so it is difficult for them to know how much she eats at home. They do report pt drinks wine daily, maybe 4-6 glasses daily. Pt a light eater, meals may consist of soup or 1 piece of pizza.   Patient having dark red output from NGT. Visit was cut short as pt then began bleeding from nose so RN was called to room. RD to re-attempt to see patient at a later date.  Per weight records, no weight history available.  Medications: Lactated ringers, IV Mg sulfate  Labs reviewed: CBGs: 102-223  NUTRITION - FOCUSED PHYSICAL EXAM:  Unable to complete as pt needing assessing.   Diet Order:   Diet Order             Diet NPO time specified  Diet effective now                   EDUCATION NEEDS:   Not appropriate for education at this  time  Skin:  Skin Assessment: Skin Integrity Issues: Skin Integrity Issues:: Incisions Incisions: 1/8 abdomen  Last BM:  1/6  Height:   Ht Readings from Last 1 Encounters:  03/21/22 5\' 1"  (1.549 m)    Weight:   Wt Readings from Last 1 Encounters:  03/21/22 42.6 kg    BMI:  Body mass index is 17.76 kg/m.  Estimated Nutritional Needs:   Kcal:  1500-1700  Protein:  75-90g  Fluid:  1.7L/day  Clayton Bibles, MS, RD, LDN Inpatient Clinical Dietitian Contact information available via Amion

## 2022-03-22 NOTE — Progress Notes (Signed)
  Transition of Care [facility]) Screening Note   Patient Details  Name: AMADI YOSHINO Date of Birth: 1934/06/26   Transition of Care [facility]) CM/SW Contact:    Roseanne Kaufman, RN Phone Number: 03/22/2022, 2:48 PM  Patient from home alone. Patient currently intubated, son Rush Landmark is POA, daughter has paperwork.   TOC will follow for d/c needs.  Transition of Care Department [facility]) has reviewed patient and no TOC needs have been identified at this time. We will continue to monitor patient advancement through interdisciplinary progression rounds. If new patient transition needs arise, please place a TOC consult.

## 2022-03-22 NOTE — Anesthesia Postprocedure Evaluation (Signed)
Anesthesia Post Note  Patient: Teresa Wright  Procedure(s) Performed: EXPLORATORY LAPAROTOMY (Abdomen) SMALL BOWEL RESECTION (Abdomen) LYSIS OF ADHESION (Abdomen)     Patient location during evaluation: SICU Anesthesia Type: General Level of consciousness: sedated Pain management: pain level controlled Vital Signs Assessment: post-procedure vital signs reviewed and stable Respiratory status: patient remains intubated per anesthesia plan Cardiovascular status: stable Postop Assessment: no apparent nausea or vomiting Anesthetic complications: no  No notable events documented.  Last Vitals:  Vitals:   03/22/22 0415 03/22/22 0430  BP: (!) 102/47 (!) 87/50  Pulse: 80 81  Resp: (!) 33 (!) 28  Temp:    SpO2: 90% 96%    Last Pain:  Vitals:   03/22/22 0329  TempSrc: Oral  PainSc:                  Barnet Glasgow

## 2022-03-22 NOTE — Progress Notes (Signed)
eLink Physician-Brief Progress Note Patient Name: Teresa Wright DOB: Jul 27, 1934 MRN: [id number]   Date of Service  03/22/2022  HPI/Events of Note  Patient hypotensive with minimal UOP.   Agitated on vent but unable to raise the orecedex d/t BP  PIV only  Aline BP 92/32, 98/31 low DBP Precedex was at 0.6 decreased to 0.5  BP  now 140/52  HR 102 seen agitated On bicarb drip 150 cc/hr  eICU Interventions  Instructed bedside RN to go ahead and give the prn Versed 1 mg as with background history of ETOH use Ordered albumin 25% 12.5 g     Intervention Category Intermediate Interventions: Hypotension - evaluation and management Minor Interventions: Agitation / anxiety - evaluation and management  Judd Lien 03/22/2022, 10:30 PM

## 2022-03-23 ENCOUNTER — Inpatient Hospital Stay: Payer: Self-pay

## 2022-03-23 ENCOUNTER — Inpatient Hospital Stay (HOSPITAL_COMMUNITY): Payer: Medicare Other

## 2022-03-23 ENCOUNTER — Encounter (HOSPITAL_COMMUNITY): Payer: Self-pay | Admitting: General Surgery

## 2022-03-23 DIAGNOSIS — K56609 Unspecified intestinal obstruction, unspecified as to partial versus complete obstruction: Secondary | ICD-10-CM | POA: Diagnosis not present

## 2022-03-23 LAB — CBC
HCT: 26.8 % — ABNORMAL LOW (ref 36.0–46.0)
Hemoglobin: 8.8 g/dL — ABNORMAL LOW (ref 12.0–15.0)
MCH: 28.7 pg (ref 26.0–34.0)
MCHC: 32.8 g/dL (ref 30.0–36.0)
MCV: 87.3 fL (ref 80.0–100.0)
Platelets: 152 10*3/uL (ref 150–400)
RBC: 3.07 MIL/uL — ABNORMAL LOW (ref 3.87–5.11)
RDW: 14.3 % (ref 11.5–15.5)
WBC: 9.3 10*3/uL (ref 4.0–10.5)
nRBC: 0.2 % (ref 0.0–0.2)

## 2022-03-23 LAB — SURGICAL PATHOLOGY

## 2022-03-23 LAB — HEPATIC FUNCTION PANEL
ALT: 1496 U/L — ABNORMAL HIGH (ref 0–44)
AST: 2463 U/L — ABNORMAL HIGH (ref 15–41)
Albumin: 2.1 g/dL — ABNORMAL LOW (ref 3.5–5.0)
Alkaline Phosphatase: 33 U/L — ABNORMAL LOW (ref 38–126)
Bilirubin, Direct: 0.2 mg/dL (ref 0.0–0.2)
Indirect Bilirubin: 0.3 mg/dL (ref 0.3–0.9)
Total Bilirubin: 0.5 mg/dL (ref 0.3–1.2)
Total Protein: 3.6 g/dL — ABNORMAL LOW (ref 6.5–8.1)

## 2022-03-23 LAB — BASIC METABOLIC PANEL
Anion gap: 10 (ref 5–15)
Anion gap: 12 (ref 5–15)
BUN: 37 mg/dL — ABNORMAL HIGH (ref 8–23)
BUN: 41 mg/dL — ABNORMAL HIGH (ref 8–23)
CO2: 30 mmol/L (ref 22–32)
CO2: 32 mmol/L (ref 22–32)
Calcium: 6.8 mg/dL — ABNORMAL LOW (ref 8.9–10.3)
Calcium: 6.4 mg/dL — CL (ref 8.9–10.3)
Chloride: 102 mmol/L (ref 98–111)
Chloride: 96 mmol/L — ABNORMAL LOW (ref 98–111)
Creatinine, Ser: 2.45 mg/dL — ABNORMAL HIGH (ref 0.44–1.00)
Creatinine, Ser: 2.51 mg/dL — ABNORMAL HIGH (ref 0.44–1.00)
GFR, Estimated: 19 mL/min — ABNORMAL LOW (ref >60)
GFR, Estimated: 18 mL/min — ABNORMAL LOW (ref >60)
Glucose, Bld: 177 mg/dL — ABNORMAL HIGH (ref 70–99)
Glucose, Bld: 145 mg/dL — ABNORMAL HIGH (ref 70–99)
Potassium: 2.6 mmol/L — CL (ref 3.5–5.1)
Potassium: 3 mmol/L — ABNORMAL LOW (ref 3.5–5.1)
Sodium: 142 mmol/L (ref 135–145)
Sodium: 140 mmol/L (ref 135–145)

## 2022-03-23 LAB — TYPE AND SCREEN
ABO/RH(D): B NEG
Antibody Screen: NEGATIVE

## 2022-03-23 LAB — PROTIME-INR
INR: 2.8 — ABNORMAL HIGH (ref 0.8–1.2)
Prothrombin Time: 29.5 seconds — ABNORMAL HIGH (ref 11.4–15.2)

## 2022-03-23 LAB — GLUCOSE, CAPILLARY
Glucose-Capillary: 170 mg/dL — ABNORMAL HIGH (ref 70–99)
Glucose-Capillary: 114 mg/dL — ABNORMAL HIGH (ref 70–99)
Glucose-Capillary: 185 mg/dL — ABNORMAL HIGH (ref 70–99)
Glucose-Capillary: 125 mg/dL — ABNORMAL HIGH (ref 70–99)
Glucose-Capillary: 83 mg/dL (ref 70–99)
Glucose-Capillary: 79 mg/dL (ref 70–99)

## 2022-03-23 LAB — URINE CULTURE: Culture: NO GROWTH

## 2022-03-23 LAB — AMMONIA: Ammonia: 26 umol/L (ref 9–35)

## 2022-03-23 LAB — ABO/RH: ABO/RH(D): B NEG

## 2022-03-23 LAB — LACTIC ACID, PLASMA: Lactic Acid, Venous: 3.1 mmol/L (ref 0.5–1.9)

## 2022-03-23 LAB — PHOSPHORUS: Phosphorus: 2.1 mg/dL — ABNORMAL LOW (ref 2.5–4.6)

## 2022-03-23 LAB — MAGNESIUM: Magnesium: 1.9 mg/dL (ref 1.7–2.4)

## 2022-03-23 MED ORDER — POTASSIUM CHLORIDE 10 MEQ/100ML IV SOLN
10.0000 meq | INTRAVENOUS | Status: AC
  Administered 2022-03-23 (×4): 10 meq via INTRAVENOUS
  Filled 2022-03-23 (×4): qty 100

## 2022-03-23 MED ORDER — ACETAMINOPHEN 10 MG/ML IV SOLN
1000.0000 mg | Freq: Four times a day (QID) | INTRAVENOUS | Status: DC
  Administered 2022-03-23: 1000 mg via INTRAVENOUS
  Filled 2022-03-23: qty 100

## 2022-03-23 MED ORDER — ALBUMIN HUMAN 25 % IV SOLN
12.5000 g | Freq: Once | INTRAVENOUS | Status: AC
  Administered 2022-03-23: 12.5 g via INTRAVENOUS
  Filled 2022-03-23: qty 50

## 2022-03-23 MED ORDER — SODIUM CHLORIDE 0.9% IV SOLUTION: Freq: Once | INTRAVENOUS | Status: DC

## 2022-03-23 MED ORDER — VITAMIN K1 10 MG/ML IJ SOLN
10.0000 mg | Freq: Once | INTRAVENOUS | Status: AC
  Administered 2022-03-23: 10 mg via INTRAVENOUS
  Filled 2022-03-23: qty 1

## 2022-03-23 MED ORDER — POTASSIUM CHLORIDE 10 MEQ/50ML IV SOLN
10.0000 meq | INTRAVENOUS | Status: AC
  Administered 2022-03-23 (×6): 10 meq via INTRAVENOUS
  Filled 2022-03-23 (×6): qty 50

## 2022-03-23 MED ORDER — SODIUM CHLORIDE 0.9% FLUSH: 10.0000 mL | INTRAVENOUS | Status: DC | PRN

## 2022-03-23 MED ORDER — SODIUM CHLORIDE 0.9% FLUSH
10.0000 mL | Freq: Two times a day (BID) | INTRAVENOUS | Status: DC
  Administered 2022-03-23 – 2022-03-25 (×5): 10 mL

## 2022-03-23 MED ORDER — CALCIUM GLUCONATE-NACL 1-0.675 GM/50ML-% IV SOLN
1.0000 g | Freq: Once | INTRAVENOUS | Status: AC
  Administered 2022-03-23: 1000 mg via INTRAVENOUS
  Filled 2022-03-23: qty 50

## 2022-03-23 NOTE — Procedures (Signed)
Extubation Procedure Note  Patient Details:   Name: Teresa Wright DOB: [birth year] MRN: [id number]   Airway Documentation:    Vent end date: 03/23/22 Vent end time: 1500   Evaluation  O2 sats: stable throughout Complications: No apparent complications Patient did tolerate procedure well. Bilateral Breath Sounds: Diminished   Yes  Gonzella Lex 03/23/2022, 3:07 PM

## 2022-03-23 NOTE — Progress Notes (Signed)
NAME:  Teresa Wright, MRN:  [id number], DOB:  1935-01-25, LOS: 2 ADMISSION DATE:  03/21/2022, CONSULTATION DATE:  1/8 REFERRING MD:  Dr. Janee Morn TRH, CHIEF COMPLAINT:  SBO/post op vent.    History of Present Illness:  Patient is encephalopathic and/or intubated. Therefore history has been obtained from chart review. 88 year old female with PMH as below, which is significant for COPD, HTN, CAD. She is visiting from [place] and presented to [facility] ED 1/7 with complaints of abdominal pain and vomiting. CT consistent with SBO and transition point in the RLQ. She was admitted to the hospitalist service for NGT decompression and bowel rest. In the early AM hours of 1/8 she had a significant worsening of pain and became hypotensive. She was taken emergently to the OR by Dr. Carolynne Edouard. She underwent exploratory laparotomy, which discovered an area of ischemic small bowel, which was resected. Bowel to bowel anastomosis was created and her abdomen was closed. She was slow to arouse post-operatively so she remained on the vent and she was transferred to ICU. PCCM consulted by [facility] for vent care.   Pertinent  Medical History   has a past medical history of COPD (chronic obstructive pulmonary disease) (HCC), Hypertension, and MI (myocardial infarction) (HCC).   Significant Hospital Events: Including procedures, antibiotic start and stop dates in addition to other pertinent events   1/7 admit with SBO 1/8 taken emergently to OR.   Interim History / Subjective:  Some hypotension overnight improved with volume and reduction of sedative infusions. Tolerating SBT this morning with some minor agitation. Bloody secretions noted on RT suctioning. Doesn't seem to be clearing.   6L positive. Only out x 24 hours.    Objective   Blood pressure (!) 92/31, pulse 65, temperature 97.8 F (36.6 C), temperature source Axillary, resp. rate 20, height 5\' 1"  (1.549 m), weight 42.6 kg, SpO2 95 %.    Vent  Mode: PRVC FiO2 (%):  [40 %-60 %] 45 % Set Rate:  [20 bmp] 20 bmp Vt Set:  [360 mL] 360 mL PEEP:  [5 cmH20] 5 cmH20 Pressure Support:  [5 cmH20] 5 cmH20 Plateau Pressure:  [11 cmH20-14 cmH20] 14 cmH20   Intake/Output Summary (Last 24 hours) at 03/23/2022 0858 Last data filed at 03/23/2022 05/22/2022 Gross per 24 hour  Intake 4904.43 ml  Output 558 ml  Net 4346.43 ml    Filed Weights   03/21/22 0444  Weight: 42.6 kg    Examination: General: elderly female in NAD on vent HENT:[place]/AT, PERRL, no JVD Lungs: Clear bilateral breath sounds Cardiovascular: RRR, no MG Abdomen: Soft, surgical dressing in place, clean and dry. Non-distended.  Extremities: No acute deformity. Neuro: [facility] Problem list     Assessment & Plan:   Small bowel obstruction: small bowel ischemic now s/p resection of that area.  - NGT to LIS - Appreciate CCS management.  - NPO - Zosyn for tentative 5 day course - Dressing changes per protocol  Acute hypoxemic respiratory failure: post op failure.  Hemoptysis: new this morning.  - Full vent support - SBT as tolerated - OK to extubate per surgery if she meets criteria.  - VAP bundle - PRN sedation for RASS goal 0 to -1 - Hopefully we can wean to extubate today.  - Give vitamin K for INR 2.9, consider FFP - Holding plavix (presumably last dose was 1/7) - CXR now, may need to consider CT or bronch depending on results.   Shock: secondary  to bowel ischemia. Improved post op.  - MIVF  Metabolic acidosis: likely lactic in the setting of shock and bowel ischemia although no lactic was checked.  - Improved. DC bicarb infusion.   AKI - Gentle hydration - Follow chemistry  COPD without exacerbation - Hold home trelegy - Budesonide, brovana, yupelri  CAD HTN - holding home losartan, metoprolol while NPO - PRN hydralazine  Hyperglycemia - CBG monitoring and SSI  Nutrition - Keep NPO for now and await bowel recovery. May need to  start TPN if no improvement in the next couple of days.    Best Practice (right click and "Reselect all SmartList Selections" daily)   Diet/type: NPO DVT prophylaxis: SCD Holding chemo ppx due to hemoptysis.  GI prophylaxis: PPI Lines: Arterial Line Foley:  Yes, and it is still needed Code Status:  DNR Last date of multidisciplinary goals of care discussion [Discussed with daughter at bedside. Son in Ct is POA, but Daughter has all documents here with her ]  Labs   CBC: Recent Labs  Lab 03/21/22 0447 03/22/22 0134 03/22/22 0731 03/22/22 1203 03/23/22 0331  WBC 9.0 17.4* 16.0* 12.8* 9.3  HGB 15.0 15.6* 10.8* 9.6* 8.8*  HCT 47.3* 54.0* 36.6 31.1* 26.8*  MCV 88.9 97.5 97.1 92.0 87.3  PLT 394 494* 198 190 152     Basic Metabolic Panel: Recent Labs  Lab 03/21/22 0447 03/22/22 0134 03/22/22 0731 03/22/22 1203 03/23/22 0331  NA 143 144 142 143 142  K 3.9 4.1 5.3* 3.8 2.6*  CL 100 115* 111 110 102  CO2 26 12* 11* 21* 30  GLUCOSE 163* 192* 132* 244* 177*  BUN 17 22 21  28* 37*  CREATININE 0.63 1.95* 1.84* 1.79* 2.45*  CALCIUM 9.5 7.6* 6.6* 6.8* 6.8*  MG  --   --  1.3*  --  1.9  PHOS  --   --  9.3*  --  2.1*    GFR: Estimated Creatinine Clearance: 11.3 mL/min (A) (by C-G formula based on SCr of 2.45 mg/dL (H)). Recent Labs  Lab 03/22/22 0134 03/22/22 0731 03/22/22 0928 03/22/22 1203 03/22/22 1516 03/23/22 0331  WBC 17.4* 16.0*  --  12.8*  --  9.3  LATICACIDVEN  --  >9.0* >9.0* 5.9* 5.8*  --      Liver Function Tests: Recent Labs  Lab 03/21/22 0447 03/22/22 0134 03/22/22 0731 03/22/22 1203  AST 16 31 1,262* 1,783*  ALT 14 <5 1,131* 1,800*  ALKPHOS 78 57 40 40  BILITOT 0.5 0.3 0.5 0.6  PROT 7.1 5.1* 3.7* 3.9*  ALBUMIN 3.9 2.5* 2.1* 2.3*    Recent Labs  Lab 03/21/22 0447  LIPASE 16    No results for input(s): "AMMONIA" in the last 168 hours.  ABG    Component Value Date/Time   PHART 7.24 (L) 03/22/2022 0855   PCO2ART 27 (L) 03/22/2022 0855    PO2ART 234 (H) 03/22/2022 0855   HCO3 12.3 (L) 03/22/2022 0855   ACIDBASEDEF 15.0 (H) 03/22/2022 0855   O2SAT 100 03/22/2022 0855     Coagulation Profile: Recent Labs  Lab 03/22/22 1203 03/23/22 0331  INR 2.9* 2.8*    Cardiac Enzymes: No results for input(s): "CKTOTAL", "CKMB", "CKMBINDEX", "TROPONINI" in the last 168 hours.  HbA1C: Hgb A1c MFr Bld  Date/Time Value Ref Range Status  03/21/2022 09:51 AM 6.3 (H) 4.8 - 5.6 % Final    Comment:    (NOTE)         Prediabetes: 5.7 - 6.4  Diabetes: >6.4         Glycemic control for adults with diabetes: <7.0     CBG: Recent Labs  Lab 03/22/22 1528 03/22/22 1943 03/22/22 2326 03/23/22 0339 03/23/22 0833  GLUCAP 217* 173* 177* 170* 114*    Review of Systems:   Patient is encephalopathic and/or intubated. Therefore history has been obtained from chart review.   Past Medical History:  She,  has a past medical history of COPD (chronic obstructive pulmonary disease) ([place]), Hypertension, and MI (myocardial infarction) ([place]).   Surgical History:   Past Surgical History:  Procedure Laterality Date   ABDOMINAL HYSTERECTOMY     BREAST SURGERY       Social History:   reports that she has never smoked. She has never used smokeless tobacco. She reports current alcohol use. She reports that she does not use drugs.   Family History:  Her family history is not on file.   Allergies Allergies  Allergen Reactions   Penicillins Swelling    facilal     Home Medications  Prior to Admission medications   Medication Sig Start Date End Date Taking? Authorizing Provider  aspirin EC 81 MG tablet Take 81 mg by mouth daily. Swallow whole.   Yes [provider]  atorvastatin (LIPITOR) 40 MG tablet Take 40 mg by mouth See admin instructions. Give 1 tablet by mouth in the evening for Hyperlipidemia. 02/11/21  Yes [provider]  clopidogrel (PLAVIX) 75 MG tablet Take 75 mg by mouth See admin instructions.  Give 1 tablet by mouth in the morning for Anticoagulant therapy. 02/11/21  Yes [provider]  fluticasone (FLONASE) 50 MCG/ACT nasal spray Place 1 spray into both nostrils See admin instructions. 1 spray in each nostril in the morning for Nasal agents. 02/11/21  Yes [provider]  ipratropium-albuterol (DUONEB) 0.5-2.5 (3) MG/3ML SOLN Inhale 3 mLs into the lungs See admin instructions. 3 ml inhale orally every 12 hours for SOB 02/10/21  Yes [provider]  losartan (COZAAR) 100 MG tablet Take 100 mg by mouth See admin instructions. Give 1 tablet by mouth in the morning for Hypertension. 02/11/21  Yes [provider]  LUMIGAN 0.01 % SOLN Place 1 drop into both eyes at bedtime. 01/25/22  Yes [provider]  metoprolol succinate (TOPROL-XL) 25 MG 24 hr tablet Take 25 mg by mouth See admin instructions. Give 1 tablet by mouth at bedtime for Hypertension. 02/10/21  Yes [provider]  montelukast (SINGULAIR) 10 MG tablet Take 10 mg by mouth See admin instructions. Give 1 tablet by mouth at bedtime for COPD. 02/10/21  Yes [provider]  nitroGLYCERIN (NITROSTAT) 0.4 MG SL tablet Place 0.4 mg under the tongue See admin instructions. Give 1 tablet sublingually every 5 minutes as needed for Chest pain. Take 1 tab up to 3 doses in 15 minutes, if chest pain persists, call MD and follow facility protocol. 02/10/21  Yes [provider]  sertraline (ZOLOFT) 50 MG tablet Take 50 mg by mouth See admin instructions. Give 1 tablet by mouth in the morning for Depression. 02/11/21  Yes [provider]  Donnal Debar 100-62.5-25 MCG/ACT AEPB Inhale 1 spray into the lungs See admin instructions. 1 inhalation inhale orally in the morning for COPD. Rinse the mouth with water without swallowing to reduce the risk of oral thrush. 02/11/21  Yes [provider]     Critical care time: 42 minutes     Georgann Housekeeper,  AGACNP-BC [facility]  See Amion for personal pager PCCM on call pager [phone] until 7pm. Please call Elink 7p-7a. [phone]  03/23/2022 8:58 AM

## 2022-03-23 NOTE — Progress Notes (Addendum)
PCCM INTERVAL PROGRESS NOTE  Evaluated patient on SBT. She is awake and following commands. Demonstrates good cough/gag and strength. RSBI 33. Will plan to extubate.   Georgann Housekeeper, AGACNP-BC [place] [facility]  See Amion for personal pager PCCM on call pager [phone] until 7pm. Please call Elink 7p-7a. [phone]  03/23/2022 2:54 PM

## 2022-03-23 NOTE — Progress Notes (Signed)
[facility] Progress Note Patient Name: Teresa Wright DOB: [birth year] MRN: [id number]   Date of Service  03/23/2022  HPI/Events of Note  Agitation - Patient resting comfortably on video assessment. QTc interval = 0.528 seconds. Not able to use Haldol at this time.   eICU Interventions  Continue present management.     Intervention Category Major Interventions: Delirium, psychosis, severe agitation - evaluation and management  [name] 03/23/2022, 8:18 PM

## 2022-03-23 NOTE — Progress Notes (Signed)
Peripherally Inserted Central Catheter Placement  The IV Nurse has discussed with the patient and/or persons authorized to consent for the patient, the purpose of this procedure and the potential benefits and risks involved with this procedure.  The benefits include less needle sticks, lab draws from the catheter, and the patient may be discharged home with the catheter. Risks include, but not limited to, infection, bleeding, blood clot (thrombus formation), and puncture of an artery; nerve damage and irregular heartbeat and possibility to perform a PICC exchange if needed/ordered by physician.  Alternatives to this procedure were also discussed.  Bard Power PICC patient education guide, fact sheet on infection prevention and patient information card has been provided to patient /or left at bedside.   Right upper arm edematous with bruising noted. Left arm with small amount of edema noted. No significant bruising noted.  PICC Placement Documentation  PICC Triple Lumen 77/82/42 Left Basilic 39 cm 2 cm (Active)  Indication for Insertion or Continuance of Line Prolonged intravenous therapies;Limited venous access - need for IV therapy >5 days (PICC only) 03/23/22 1300  Exposed Catheter (cm) 2 cm 03/23/22 1300  Site Assessment Clean, Dry, Intact 03/23/22 1300  Lumen #1 Status Flushed;Saline locked;Blood return noted 03/23/22 1300  Lumen #2 Status Flushed;Saline locked;Blood return noted 03/23/22 1300  Lumen #3 Status Flushed;Saline locked;Blood return noted 03/23/22 1300  Dressing Type Transparent;Securing device 03/23/22 1300  Dressing Status Antimicrobial disc in place;Clean, Dry, Intact 03/23/22 1300  Safety Lock Not Applicable 35/36/14 4315  Line Care Connections checked and tightened 03/23/22 1300  Line Adjustment (NICU/IV Team Only) No 03/23/22 1300  Dressing Intervention New dressing 03/23/22 1300  Dressing Change Due 03/30/22 03/23/22 1300       [place] 03/23/2022, 1:15 PM

## 2022-03-23 NOTE — Progress Notes (Signed)
Progress Note  1 Day Post-Op  Subjective: Alert on the vent. No family at bedside this AM.   Objective: Vital signs in last 24 hours: Temp:  [93.3 F (34.1 C)-98.1 F (36.7 C)] 97.7 F (36.5 C) (01/09 0800) Pulse Rate:  [62-103] 65 (01/09 0600) Resp:  [14-23] 20 (01/09 0600) BP: (73-166)/(27-91) 92/31 (01/09 0400) SpO2:  [87 %-100 %] 95 % (01/09 0600) Arterial Line BP: (82-167)/(30-70) 120/43 (01/09 0600) FiO2 (%):  [40 %-60 %] 45 % (01/09 0400) Last BM Date : 03/20/22  Intake/Output from previous day: 01/08 0701 - 01/09 0700 In: 4904.4 [I.V.:3343.8; IV Piggyback:1540.6] Out: 558 [Urine:483; Emesis/NG output:75] Intake/Output this shift: No intake/output data recorded.  PE: General: pleasant, WD, thin elderly female who is on the vent  Heart: regular, rate, and rhythm.   Lungs: on the vent Abd: soft, appropriately ttp, ND, NGT with small amount dark bloody drainage, midline wound clean   Lab Results:  Recent Labs    03/22/22 1203 03/23/22 0331  WBC 12.8* 9.3  HGB 9.6* 8.8*  HCT 31.1* 26.8*  PLT 190 152   BMET Recent Labs    03/22/22 1203 03/23/22 0331  NA 143 142  K 3.8 2.6*  CL 110 102  CO2 21* 30  GLUCOSE 244* 177*  BUN 28* 37*  CREATININE 1.79* 2.45*  CALCIUM 6.8* 6.8*   PT/INR Recent Labs    03/22/22 1203 03/23/22 0331  LABPROT 29.9* 29.5*  INR 2.9* 2.8*   CMP     Component Value Date/Time   NA 142 03/23/2022 0331   K 2.6 (LL) 03/23/2022 0331   CL 102 03/23/2022 0331   CO2 30 03/23/2022 0331   GLUCOSE 177 (H) 03/23/2022 0331   BUN 37 (H) 03/23/2022 0331   CREATININE 2.45 (H) 03/23/2022 0331   CALCIUM 6.8 (L) 03/23/2022 0331   PROT 3.6 (L) 03/23/2022 0331   ALBUMIN 2.1 (L) 03/23/2022 0331   AST 2,463 (H) 03/23/2022 0331   ALT 1,496 (H) 03/23/2022 0331   ALKPHOS 33 (L) 03/23/2022 0331   BILITOT 0.5 03/23/2022 0331   GFRNONAA 19 (L) 03/23/2022 0331   Lipase     Component Value Date/Time   LIPASE 16 03/21/2022 0447        Studies/Results: DG Chest 1 View  Result Date: 03/23/2022 CLINICAL DATA:  Increased tracheal secretions. EXAM: CHEST  1 VIEW COMPARISON:  Chest radiograph 03/22/2022, 03/21/2022 FINDINGS: Endotracheal tube tip projects approximately 4.5 cm above the carina. Enteric tube tip is below the diaphragm but not included on the image. Hazy bibasilar airspace opacities, right-greater-than-left with moderate right and small left pleural effusions. No pneumothorax. Heart is normal in size. Aortic calcifications. IMPRESSION: Worsening bibasilar airspace opacities, right-greater-than-left, which may reflect atelectasis versus pneumonia in the appropriate clinical context. Moderate right and small left pleural effusions, increased compared to yesterday's exam. Electronically Signed   By: Sherron Ales M.D.   On: 03/23/2022 08:39   DG Chest Port 1 View  Result Date: 03/22/2022 CLINICAL DATA:  Shortness of breath. EXAM: PORTABLE CHEST 1 VIEW COMPARISON:  03/21/2022 and CT abdomen 03/21/2022. FINDINGS: Endotracheal tube terminates approximately 2.8 cm above the carina. Nasogastric tube is followed into the stomach with the tip projecting beyond the inferior margin of the image. Heart size normal. Thoracic aorta is calcified. Small bilateral pleural effusions, layering slightly on the right given semi upright position. There may be right basilar airspace opacification as well. Lungs are otherwise clear. IMPRESSION: 1. Small bilateral pleural effusions, right greater  than left. 2. Possible right basilar airspace opacification. Electronically Signed   By: Lorin Picket M.D.   On: 03/22/2022 08:19   DG Abd Portable 1V-Small Bowel Obstruction Protocol-initial, 8 hr delay  Result Date: 03/21/2022 CLINICAL DATA:  8 hour delay for small bowel obstruction. EXAM: PORTABLE ABDOMEN - 1 VIEW COMPARISON:  CT abdomen and pelvis 03/21/2022 FINDINGS: Enteric tube tip is at the gastroesophageal junction. Contrast is seen within  the stomach. No contrast seen within small bowel or colon. Colon is mildly dilated measuring up to 3.1 cm. Minimal colonic gas present. There is contrast within the bladder. Left bladder diverticulum is present. IMPRESSION: 1. Enteric tube tip is at the gastroesophageal junction. Recommend advancing tube. 2. Contrast is only seen within the stomach. Small bowel is mildly dilated. Findings are compatible small bowel obstruction. Electronically Signed   By: Ronney Asters M.D.   On: 03/21/2022 20:26    Anti-infectives: Anti-infectives (From admission, onward)    Start     Dose/Rate Route Frequency Ordered Stop   03/22/22 1000  ceFEPIme (MAXIPIME) 2 g in sodium chloride 0.9 % 100 mL IVPB        2 g 200 mL/hr over 30 Minutes Intravenous Every 24 hours 03/22/22 0859     03/22/22 1000  metroNIDAZOLE (FLAGYL) IVPB 500 mg        500 mg 100 mL/hr over 60 Minutes Intravenous Every 12 hours 03/22/22 0859     03/22/22 0522  ciprofloxacin (CIPRO) 400 MG/200ML IVPB       Note to Pharmacy: Nicholes Rough S: cabinet override      03/22/22 0522 03/22/22 0556   03/22/22 0500  ciprofloxacin (CIPRO) IVPB 200 mg        200 mg 100 mL/hr over 60 Minutes Intravenous  Once 03/22/22 0400     03/22/22 0430  piperacillin-tazobactam (ZOSYN) 3.375 GM/50ML IVPB       Note to Pharmacy: Herbie Drape J: cabinet override      03/22/22 0430 03/22/22 1644        Assessment/Plan SBO  POD1 s/p exploratory laparotomy, small bowel resection, LOA 03/22/22 Dr. Marlou Starks - continue NGT to LIWS  - await return in bowel function  - mobilize once extubated - do not recommend TPN at this time but if patient without bowel function for another 2-3 days will need to consider TPN  - BID wet to dry dressing to midline  ABL anemia - post-op and suspect some hemodilution as well, hgb is 8.8 this AM from 9.6 yesterday, continue to monitor   FEN: NPO, IVF, NGT to LIWS  VTE: SCDs, ok to have SQH or LMWH today and monitor hgb  ID: cefepime/flagyl  through POD5, leukocytosis improving  - below per CCM -  COPD with acute post-op hypoxic respiratory failure - vent per CCM, ok to extubate from surgery standpoint Septic shock - improving post-op AKI - Cr up today, IV hydration and monitor UOP CAD HTN Hyperglycemia - SSI  LOS: 2 days     Norm Parcel, [facility] 03/23/2022, 9:30 AM Please see Amion for pager number during day hours 7:00am-4:30pm

## 2022-03-23 NOTE — Progress Notes (Signed)
[facility] Progress Note Patient Name: Teresa Wright DOB: 1934-07-10 MRN: [id number]   Date of Service  03/23/2022  HPI/Events of Note  Still oliguric. BP reportedly improved with albumin K 2.6, creatinine 2.45  eICU Interventions  Ordered another albumin 25% 12.g Ordered potassium 10 meqs x 4 doses.     Intervention Category Intermediate Interventions: Electrolyte abnormality - evaluation and management;Oliguria - evaluation and management  Judd Lien 03/23/2022, 4:58 AM

## 2022-03-24 ENCOUNTER — Inpatient Hospital Stay (HOSPITAL_COMMUNITY): Payer: Medicare Other

## 2022-03-24 DIAGNOSIS — K56609 Unspecified intestinal obstruction, unspecified as to partial versus complete obstruction: Secondary | ICD-10-CM | POA: Diagnosis not present

## 2022-03-24 LAB — POCT I-STAT 7, (LYTES, BLD GAS, ICA,H+H)
Acid-base deficit: 23 mmol/L — ABNORMAL HIGH (ref 0.0–2.0)
Bicarbonate: 8.5 mmol/L — ABNORMAL LOW (ref 20.0–28.0)
Calcium, Ion: 0.97 mmol/L — ABNORMAL LOW (ref 1.15–1.40)
HCT: 25 % — ABNORMAL LOW (ref 36.0–46.0)
Hemoglobin: 8.5 g/dL — ABNORMAL LOW (ref 12.0–15.0)
O2 Saturation: 100 %
Potassium: 5.5 mmol/L — ABNORMAL HIGH (ref 3.5–5.1)
Sodium: 141 mmol/L (ref 135–145)
TCO2: 10 mmol/L — ABNORMAL LOW (ref 22–32)
pCO2 arterial: 40.9 mmHg (ref 32–48)
pH, Arterial: 6.925 — CL (ref 7.35–7.45)
pO2, Arterial: 477 mmHg — ABNORMAL HIGH (ref 83–108)

## 2022-03-24 LAB — CBC
HCT: 24.8 % — ABNORMAL LOW (ref 36.0–46.0)
Hemoglobin: 8.2 g/dL — ABNORMAL LOW (ref 12.0–15.0)
MCH: 29 pg (ref 26.0–34.0)
MCHC: 33.1 g/dL (ref 30.0–36.0)
MCV: 87.6 fL (ref 80.0–100.0)
Platelets: 152 10*3/uL (ref 150–400)
RBC: 2.83 MIL/uL — ABNORMAL LOW (ref 3.87–5.11)
RDW: 14.3 % (ref 11.5–15.5)
WBC: 11 10*3/uL — ABNORMAL HIGH (ref 4.0–10.5)
nRBC: 0.3 % — ABNORMAL HIGH (ref 0.0–0.2)

## 2022-03-24 LAB — BPAM FFP
Blood Product Expiration Date: 202401142359
Blood Product Expiration Date: 202401142359
ISSUE DATE / TIME: 202401091328
ISSUE DATE / TIME: 202401091508
Unit Type and Rh: 7300
Unit Type and Rh: 7300

## 2022-03-24 LAB — GLUCOSE, CAPILLARY
Glucose-Capillary: 84 mg/dL (ref 70–99)
Glucose-Capillary: 71 mg/dL (ref 70–99)
Glucose-Capillary: 70 mg/dL (ref 70–99)
Glucose-Capillary: 84 mg/dL (ref 70–99)
Glucose-Capillary: 90 mg/dL (ref 70–99)

## 2022-03-24 LAB — PREPARE FRESH FROZEN PLASMA: Unit division: 0

## 2022-03-24 LAB — COMPREHENSIVE METABOLIC PANEL
ALT: 2155 U/L — ABNORMAL HIGH (ref 0–44)
AST: 2652 U/L — ABNORMAL HIGH (ref 15–41)
Albumin: 2.9 g/dL — ABNORMAL LOW (ref 3.5–5.0)
Alkaline Phosphatase: 50 U/L (ref 38–126)
Anion gap: 7 (ref 5–15)
BUN: 47 mg/dL — ABNORMAL HIGH (ref 8–23)
CO2: 32 mmol/L (ref 22–32)
Calcium: 6.7 mg/dL — ABNORMAL LOW (ref 8.9–10.3)
Chloride: 97 mmol/L — ABNORMAL LOW (ref 98–111)
Creatinine, Ser: 3.05 mg/dL — ABNORMAL HIGH (ref 0.44–1.00)
GFR, Estimated: 14 mL/min — ABNORMAL LOW (ref >60)
Glucose, Bld: 79 mg/dL (ref 70–99)
Potassium: 4.5 mmol/L (ref 3.5–5.1)
Sodium: 136 mmol/L (ref 135–145)
Total Bilirubin: 0.8 mg/dL (ref 0.3–1.2)
Total Protein: 4.8 g/dL — ABNORMAL LOW (ref 6.5–8.1)

## 2022-03-24 LAB — MAGNESIUM: Magnesium: 1.8 mg/dL (ref 1.7–2.4)

## 2022-03-24 LAB — PHOSPHORUS: Phosphorus: 2.8 mg/dL (ref 2.5–4.6)

## 2022-03-24 LAB — PROTIME-INR
INR: 2 — ABNORMAL HIGH (ref 0.8–1.2)
Prothrombin Time: 22.9 seconds — ABNORMAL HIGH (ref 11.4–15.2)

## 2022-03-24 MED ORDER — THIAMINE HCL 100 MG/ML IJ SOLN
100.0000 mg | Freq: Every day | INTRAMUSCULAR | Status: DC
  Administered 2022-03-24 – 2022-03-25 (×2): 100 mg via INTRAVENOUS
  Filled 2022-03-24 (×2): qty 2

## 2022-03-24 MED ORDER — METOPROLOL TARTRATE 5 MG/5ML IV SOLN: 2.5000 mg | INTRAVENOUS | Status: DC | PRN

## 2022-03-24 MED ORDER — LORAZEPAM 1 MG PO TABS: 1.0000 mg | ORAL_TABLET | ORAL | Status: DC | PRN

## 2022-03-24 MED ORDER — LORAZEPAM 2 MG/ML IJ SOLN
0.5000 mg | INTRAMUSCULAR | Status: DC | PRN
  Administered 2022-03-24: 1 mg via INTRAVENOUS
  Administered 2022-03-24: 0.5 mg via INTRAVENOUS
  Filled 2022-03-24 (×2): qty 1

## 2022-03-24 MED ORDER — THIAMINE MONONITRATE 100 MG PO TABS: 100.0000 mg | ORAL_TABLET | Freq: Every day | ORAL | Status: DC

## 2022-03-24 MED ORDER — HEPARIN SODIUM (PORCINE) 5000 UNIT/ML IJ SOLN
5000.0000 [IU] | Freq: Three times a day (TID) | INTRAMUSCULAR | Status: DC
  Administered 2022-03-24 – 2022-03-25 (×3): 5000 [IU] via SUBCUTANEOUS
  Filled 2022-03-24 (×3): qty 1

## 2022-03-24 MED ORDER — SODIUM CHLORIDE 0.9 % IV SOLN
1.0000 g | INTRAVENOUS | Status: DC
  Administered 2022-03-24 – 2022-03-25 (×2): 1 g via INTRAVENOUS
  Filled 2022-03-24 (×2): qty 10

## 2022-03-24 MED ORDER — DEXTROSE 10 % IV SOLN: INTRAVENOUS | Status: DC

## 2022-03-24 MED ORDER — ALBUMIN HUMAN 25 % IV SOLN
25.0000 g | Freq: Four times a day (QID) | INTRAVENOUS | Status: AC
  Administered 2022-03-24 – 2022-03-25 (×3): 25 g via INTRAVENOUS
  Filled 2022-03-24 (×2): qty 100

## 2022-03-24 MED ORDER — ASPIRIN 81 MG PO CHEW: 81.0000 mg | CHEWABLE_TABLET | Freq: Every day | ORAL | Status: DC

## 2022-03-24 MED ORDER — LORAZEPAM 2 MG/ML IJ SOLN: 1.0000 mg | INTRAMUSCULAR | Status: DC | PRN

## 2022-03-24 MED ORDER — CALCIUM GLUCONATE-NACL 1-0.675 GM/50ML-% IV SOLN
1.0000 g | Freq: Once | INTRAVENOUS | Status: AC
  Administered 2022-03-24: 1000 mg via INTRAVENOUS
  Filled 2022-03-24: qty 50

## 2022-03-24 MED ORDER — ORAL CARE MOUTH RINSE: 15.0000 mL | OROMUCOSAL | Status: DC | PRN

## 2022-03-24 MED ORDER — FUROSEMIDE 10 MG/ML IJ SOLN
80.0000 mg | Freq: Once | INTRAMUSCULAR | Status: AC
  Administered 2022-03-24: 80 mg via INTRAVENOUS
  Filled 2022-03-24: qty 8

## 2022-03-24 MED ORDER — LORAZEPAM 2 MG/ML IJ SOLN
1.0000 mg | INTRAMUSCULAR | Status: DC | PRN
  Administered 2022-03-24: 1 mg via INTRAVENOUS
  Filled 2022-03-24: qty 1

## 2022-03-24 MED ORDER — HALOPERIDOL LACTATE 5 MG/ML IJ SOLN
1.0000 mg | Freq: Once | INTRAMUSCULAR | Status: AC
  Administered 2022-03-24: 1 mg via INTRAVENOUS
  Filled 2022-03-24: qty 1

## 2022-03-24 NOTE — Progress Notes (Signed)
[facility] Progress Note Patient Name: Teresa Wright DOB: [birth year] MRN: [id number]   Date of Service  03/24/2022  HPI/Events of Note  Hypertenison - BP = 165/54. HR = 84. Patient Toprol XL at home.  eICU Interventions  Plan: Metoprolol 2.5 mg IV Q 4 hours PRN SBP > 160. Hold dose for HR < 60.     Intervention Category Major Interventions: Hypertension - evaluation and management  Lysle Dingwall 03/24/2022, 8:48 PM

## 2022-03-24 NOTE — Progress Notes (Signed)
Pharmacy Antibiotic Note  Teresa Wright is a 88 y.o. female who presented to the ED on 03/21/2022 with c/o abdominal pain and n/v.  Abdominal CT on 03/21/22 showed SBO.  She underwent exp lap on 03/22/22 (noted to have necrotic small bowel in OR) with small bowel resection and lysis of adhesions.  She's currently on cefepime and flagyl for intra-abdominal infection.  Today, 03/24/2022: - day #3 abx - wbc up 11, Tmax 99.4 - Scr trending up 3.05 (crcl <10); UOP 0.1 ml/kg/hr; bicarb drip d/ced on 1/9   Plan: - Adjust cefepime to 1 gm IV q24h - flagyl 500 mg IV q12h - monitor renal function closely  ____________________________________  Height: 5\' 1"  (154.9 cm) Weight: 42.6 kg (94 lb) IBW/kg (Calculated) : 47.8  Temp (24hrs), Avg:97.8 F (36.6 C), Min:97.2 F (36.2 C), Max:99.4 F (37.4 C)  Recent Labs  Lab 03/22/22 0134 03/22/22 0731 03/22/22 0928 03/22/22 1203 03/22/22 1516 03/23/22 0331 03/23/22 1037 03/23/22 1500 03/24/22 0406  WBC 17.4* 16.0*  --  12.8*  --  9.3  --   --  11.0*  CREATININE 1.95* 1.84*  --  1.79*  --  2.45*  --  2.51* 3.05*  LATICACIDVEN  --  >9.0* >9.0* 5.9* 5.8*  --  3.1*  --   --      Estimated Creatinine Clearance: 9.1 mL/min (A) (by C-G formula based on SCr of 3.05 mg/dL (H)).    Allergies  Allergen Reactions   Penicillins Swelling    facilal   1/8 cefepime>> 1/8 flagyl>>   1/8 MRSA PCR: neg  1/8 ucx: neg FINAL  Thank you for allowing pharmacy to be a part of this patient's care.  Lynelle Doctor 03/24/2022 8:51 AM

## 2022-03-24 NOTE — Progress Notes (Signed)
NAME:  Teresa Wright, MRN:  [id number], DOB:  [birth year], LOS: 3 ADMISSION DATE:  03/21/2022, CONSULTATION DATE:  1/8 REFERRING MD:  Dr. Grandville Silos TRH, CHIEF COMPLAINT:  SBO/post op vent.    History of Present Illness:  Patient is encephalopathic and/or intubated. Therefore history has been obtained from chart review. 88 year old female with PMH as below, which is significant for COPD, HTN, CAD. She is visiting from [place] and presented to [facility] ED 1/7 with complaints of abdominal pain and vomiting. CT consistent with SBO and transition point in the RLQ. She was admitted to the hospitalist service for NGT decompression and bowel rest. In the early AM hours of 1/8 she had a significant worsening of pain and became hypotensive. She was taken emergently to the OR by Dr. Marlou Starks. She underwent exploratory laparotomy, which discovered an area of ischemic small bowel, which was resected. Bowel to bowel anastomosis was created and her abdomen was closed. She was slow to arouse post-operatively so she remained on the vent and she was transferred to ICU. PCCM consulted by [facility] for vent care.   Pertinent  Medical History   has a past medical history of COPD (chronic obstructive pulmonary disease) ([place]), Hypertension, and MI (myocardial infarction) ([place]).   Significant Hospital Events: Including procedures, antibiotic start and stop dates in addition to other pertinent events   1/7 admit with SBO 1/8 taken emergently to OR.  1/9 extubated  Interim History / Subjective:   Agitation overnight s/p haldol.  Reports of daily/evening ETOH use at home- red wine Continues to moan but denies pain, states she doesn't know why she's moaning CBGs in the 70's  Objective   Blood pressure (!) 105/38, pulse 95, temperature 99.4 F (37.4 C), temperature source Axillary, resp. rate 12, height 5\' 1"  (1.549 m), weight 42.6 kg, SpO2 94 %.    Vent Mode: CPAP;PSV FiO2 (%):  [45 %] 45 % Set Rate:  [20 bmp] 20  bmp Vt Set:  [360 mL] 360 mL PEEP:  [5 cmH20] 5 cmH20 Pressure Support:  [5 cmH20] 5 cmH20 Plateau Pressure:  [14 cmH20] 14 cmH20   Intake/Output Summary (Last 24 hours) at 03/24/2022 0851 Last data filed at 03/24/2022 0600 Gross per 24 hour  Intake 2371.87 ml  Output 263 ml  Net 2108.87 ml   Filed Weights   03/21/22 0444  Weight: 42.6 kg    Examination: General:  Frail elderly female sitting upright in bed in NAD HEENT: MM pink/dry, pupils 3/reactive, anicteric, R NGT with bilious output Neuro: awakens to verbal easily, says her name and will follow commands in all extremities CV: rr, NSR, no murmur PULM:  non labored, clear and diminished GI: soft, ND, no guarding/ tenderness, few BS, midline dressing cdi, foley- pale yellow, FMS Extremities: warm/dry, no LE edema, some chronic venous stasis changes, mild dependent edema BUE, L PICC, L radial aline  Skin: scattered bruising to arms   UOP 148 ml/ 24hrs  Stool 120ml/ 24hrs +2.1L Net +8.2L   NGT output 134ml/ 24hrs   Tmax 99.4  Labs > K 4.5, bicarb 32, BUN/ sCr 41/2.51> 47/ 3.05, mag 1.8, phos 2.8, AST/ ALT> 2463/ 1496> 2652/ 2155, WBC 11, H/H 8.2/ 24.8, INR 2.8> 2   Resolved Hospital Problem list   Metabolic acidosis   Assessment & Plan:   Small bowel obstruction: small bowel ischemic now s/p resection of that area.  - per [facility] management - cont NGT to LWIS - remains NPO - cont  cefepime/ flagyl for 5 days per CCS - BID wet/ dry dressing changes per CCS - possible embolic event, but has remained NSR on tele, no evidence of afib - dilaudid prn pain, avoid tylenol with LFTs  Acute hypoxemic respiratory failure: post op failure.  Hemoptysis, 1/9 - extubated 1/9.  No further episodes of hemoptysis - cont to wean supplemental O2 for sat goal > 92% - aggressive pulm hygiene> IS, moblize, PT - DNR/ DNI  COPD without exacerbation - Hold home trelegy - Budesonide, brovana, yupelri  Shock: secondary to bowel  ischemia. Improved post op.  - remains off pressors - d/c aline - IVC with little variation on bedside US.  Looks dry. - UC neg  ABLA Hemoptysis 1/9 - slow drift in H/H but stable, no evidence of bleeding today - ok to start VTE ppx per CCS.  Will start heparin SQ and monitor closely  - daily CBC, transfuse for Hgb < 7 or active bleeding  AKI with oliguria , suspect ATN Hypocalcemia  - albumin and lasix today challenge  - consider renal consult pending GOC, possible short trial of HD, but no long term.  Patient says no to dialysis at bedside but with intermittent confusion will need to discuss with her children.  No immediate needs for iHD - calcium gluconate 1gm - keep foley today - Trend BMP / strict I/O's  - Replace electrolytes as indicated - Avoid nephrotoxic agents, ensure adequate renal perfusion - no renal obstruction/ hydro on CT a/p 1/7  Transaminitis with coagulapathy  - felt to be shock liver - RUQ US> c/w fatty liver, patent portal vein - improving INR, cont to monitor/ trend   CAD HTN MI - cont to hold losartan, metoprolol, lipitor  - PRN hydralazine - cont holding plavix   Hyperglycemia, resolved  - stop SSI.  Monitor for hypoglycemia, CBGs q4  At risk for malnutrition  - remains NPO.  Might need to consider TPN, however can be complicated by LFTs.    Acute metabolic encephalopathy vs agitated delirium vs ETOH w/d - reported drinks red wine nightly - add CIWA protocol with ativan/ thiamine> given LFTs will need to monitor closely  - delirium precautions    Best Practice (right click and "Reselect all SmartList Selections" daily)   Diet/type: NPO DVT prophylaxis: SCD starting heparin SQ GI prophylaxis: PPI Lines: Central line and Arterial Line Foley:  Yes, and it is still needed Code Status:  DNR Last date of multidisciplinary goals of care discussion [Discussed with daughter at bedside. Son in Ct is POA, but Daughter has all documents here with  her ]  Pending update 1/10, no family at bedside  Labs   CBC: Recent Labs  Lab 03/22/22 0134 03/22/22 0731 03/22/22 1203 03/23/22 0331 03/24/22 0406  WBC 17.4* 16.0* 12.8* 9.3 11.0*  HGB 15.6* 10.8* 9.6* 8.8* 8.2*  HCT 54.0* 36.6 31.1* 26.8* 24.8*  MCV 97.5 97.1 92.0 87.3 87.6  PLT 494* 198 190 152 152    Basic Metabolic Panel: Recent Labs  Lab 03/22/22 0731 03/22/22 1203 03/23/22 0331 03/23/22 1500 03/24/22 0406  NA 142 143 142 140 136  K 5.3* 3.8 2.6* 3.0* 4.5  CL 111 110 102 96* 97*  CO2 11* 21* 30 32 32  GLUCOSE 132* 244* 177* 145* 79  BUN 21 28* 37* 41* 47*  CREATININE 1.84* 1.79* 2.45* 2.51* 3.05*  CALCIUM 6.6* 6.8* 6.8* 6.4* 6.7*  MG 1.3*  --  1.9  --  1.8  PHOS  9.3*  --  2.1*  --  2.8   GFR: Estimated Creatinine Clearance: 9.1 mL/min (A) (by C-G formula based on SCr of 3.05 mg/dL (H)). Recent Labs  Lab 03/22/22 0731 03/22/22 0928 03/22/22 1203 03/22/22 1516 03/23/22 0331 03/23/22 1037 03/24/22 0406  WBC 16.0*  --  12.8*  --  9.3  --  11.0*  LATICACIDVEN >9.0* >9.0* 5.9* 5.8*  --  3.1*  --     Liver Function Tests: Recent Labs  Lab 03/22/22 0134 03/22/22 0731 03/22/22 1203 03/23/22 0331 03/24/22 0406  AST 31 1,262* 1,783* 2,463* 2,652*  ALT <5 1,131* 1,800* 1,496* 2,155*  ALKPHOS 57 40 40 33* 50  BILITOT 0.3 0.5 0.6 0.5 0.8  PROT 5.1* 3.7* 3.9* 3.6* 4.8*  ALBUMIN 2.5* 2.1* 2.3* 2.1* 2.9*   Recent Labs  Lab 03/21/22 0447  LIPASE 16   Recent Labs  Lab 03/23/22 1037  AMMONIA 26    ABG    Component Value Date/Time   PHART 7.24 (L) 03/22/2022 0855   PCO2ART 27 (L) 03/22/2022 0855   PO2ART 234 (H) 03/22/2022 0855   HCO3 12.3 (L) 03/22/2022 0855   ACIDBASEDEF 15.0 (H) 03/22/2022 0855   O2SAT 100 03/22/2022 0855     Coagulation Profile: Recent Labs  Lab 03/22/22 1203 03/23/22 0331 03/24/22 0406  INR 2.9* 2.8* 2.0*    Cardiac Enzymes: No results for input(s): "CKTOTAL", "CKMB", "CKMBINDEX", "TROPONINI" in the last 168  hours.  HbA1C: Hgb A1c MFr Bld  Date/Time Value Ref Range Status  03/21/2022 09:51 AM 6.3 (H) 4.8 - 5.6 % Final    Comment:    (NOTE)         Prediabetes: 5.7 - 6.4         Diabetes: >6.4         Glycemic control for adults with diabetes: <7.0     CBG: Recent Labs  Lab 03/23/22 1643 03/23/22 1940 03/23/22 2328 03/24/22 0404 03/24/22 0756  GLUCAP 125* 83 79 84 71    Critical care time: 40 minutes       Amanda Cockayne [facility] 03/24/2022, 10:46 AM  See Amion for pager If no response to pager, please call PCCM consult pager After 7:00 pm call Elink

## 2022-03-24 NOTE — IPAL (Signed)
  Interdisciplinary Goals of Care Family Meeting   Date carried out: 03/24/2022  Location of the meeting: Bedside  Member's involved: Nurse Practitioner and Family Member or next of kin  Maunie or acting medical decision maker: Teresa Wright, son from [place] and daughter, Teresa Wright    Discussion: We discussed goals of care for Teresa Wright .  Updated Teresa Wright as he just arrived from out of town.  States she had clear wishes and poor health at baseline back home.  Will continue current supportive care at this time, but if decompensates will shift to comfort focused care.  No pressors, DNR/ DNI, and no time limited trial of hemodialysis.    Code status:   Code Status: DNR   Disposition: Continue current acute care  Time spent for the meeting: 15 mins       Kennieth Rad, [facility] 03/24/2022, 3:18 PM  See Amion for pager If no response to pager, please call PCCM consult pager After 7:00 pm call Elink

## 2022-03-24 NOTE — Progress Notes (Signed)
Progress Note  2 Days Post-Op  Subjective: Pt extubated late yesterday, required haldol overnight for agitation. Patient redirectable this AM but definitely confused at times. Denies pain but repeatedly saying "help me". No family at bedside yet today but spoke with her daughter and son at bedside yesterday afternoon prior to extubation.   Objective: Vital signs in last 24 hours: Temp:  [97.2 F (36.2 C)-99.4 F (37.4 C)] 98.2 F (36.8 C) (01/10 0800) Pulse Rate:  [61-140] 95 (01/10 0754) Resp:  [6-24] 12 (01/10 0754) BP: (105-155)/(38-62) 105/38 (01/10 0400) SpO2:  [86 %-100 %] 94 % (01/10 0754) Arterial Line BP: (106-188)/(38-66) 164/54 (01/10 0754) FiO2 (%):  [45 %] 45 % (01/09 1419) Last BM Date : 03/20/22  Intake/Output from previous day: 01/09 0701 - 01/10 0700 In: 2371.9 [I.V.:909.1; Blood:404; IV Piggyback:1033.7] Out: 263 [Urine:148; Emesis/NG output:115] Intake/Output this shift: No intake/output data recorded.  PE: General: pleasant, WD, thin elderly female who is alert and ill appearing  Heart: regular, rate, and rhythm.   Lungs: rales bilaterally  Abd: soft, appropriately ttp, ND, NGT with thin bilious drainage, midline wound clean GU: foley present   Lab Results:  Recent Labs    03/23/22 0331 03/24/22 0406  WBC 9.3 11.0*  HGB 8.8* 8.2*  HCT 26.8* 24.8*  PLT 152 152    BMET Recent Labs    03/23/22 1500 03/24/22 0406  NA 140 136  K 3.0* 4.5  CL 96* 97*  CO2 32 32  GLUCOSE 145* 79  BUN 41* 47*  CREATININE 2.51* 3.05*  CALCIUM 6.4* 6.7*    PT/INR Recent Labs    03/23/22 0331 03/24/22 0406  LABPROT 29.5* 22.9*  INR 2.8* 2.0*    CMP     Component Value Date/Time   NA 136 03/24/2022 0406   K 4.5 03/24/2022 0406   CL 97 (L) 03/24/2022 0406   CO2 32 03/24/2022 0406   GLUCOSE 79 03/24/2022 0406   BUN 47 (H) 03/24/2022 0406   CREATININE 3.05 (H) 03/24/2022 0406   CALCIUM 6.7 (L) 03/24/2022 0406   PROT 4.8 (L) 03/24/2022 0406    ALBUMIN 2.9 (L) 03/24/2022 0406   AST 2,652 (H) 03/24/2022 0406   ALT 2,155 (H) 03/24/2022 0406   ALKPHOS 50 03/24/2022 0406   BILITOT 0.8 03/24/2022 0406   GFRNONAA 14 (L) 03/24/2022 0406   Lipase     Component Value Date/Time   LIPASE 16 03/21/2022 0447       Studies/Results: US Abdomen Limited RUQ (LIVER/GB)  Result Date: 03/24/2022 CLINICAL DATA:  Transaminitis. EXAM: ULTRASOUND ABDOMEN LIMITED RIGHT UPPER QUADRANT COMPARISON:  CT of the abdomen pelvis 03/21/2022 FINDINGS: Gallbladder: No gallstones or wall thickening visualized. No sonographic Murphy sign noted by sonographer. Common bile duct: Diameter: 2.3 mm, within normal limits Liver: The liver is somewhat echogenic and hyperechoic. No discrete lesions are present. Portal vein is patent on color Doppler imaging with normal direction of blood flow towards the liver. Other: None. IMPRESSION: 1. Fatty infiltration of the liver. 2. Normal sonographic appearance of the gallbladder. Electronically Signed   By: San Morelle M.D.   On: 03/24/2022 09:33   [place] EKG SITE RITE  Result Date: 03/23/2022 If Site Rite image not attached, placement could not be confirmed due to current cardiac rhythm.  DG Chest 1 View  Result Date: 03/23/2022 CLINICAL DATA:  Increased tracheal secretions. EXAM: CHEST  1 VIEW COMPARISON:  Chest radiograph 03/22/2022, 03/21/2022 FINDINGS: Endotracheal tube tip projects approximately 4.5 cm above the  carina. Enteric tube tip is below the diaphragm but not included on the image. Hazy bibasilar airspace opacities, right-greater-than-left with moderate right and small left pleural effusions. No pneumothorax. Heart is normal in size. Aortic calcifications. IMPRESSION: Worsening bibasilar airspace opacities, right-greater-than-left, which may reflect atelectasis versus pneumonia in the appropriate clinical context. Moderate right and small left pleural effusions, increased compared to yesterday's exam. Electronically  Signed   By: Ileana Roup M.D.   On: 03/23/2022 08:39    Anti-infectives: Anti-infectives (From admission, onward)    Start     Dose/Rate Route Frequency Ordered Stop   03/24/22 1000  ceFEPIme (MAXIPIME) 1 g in sodium chloride 0.9 % 100 mL IVPB        1 g 200 mL/hr over 30 Minutes Intravenous Every 24 hours 03/24/22 0749     03/22/22 1000  ceFEPIme (MAXIPIME) 2 g in sodium chloride 0.9 % 100 mL IVPB  Status:  Discontinued        2 g 200 mL/hr over 30 Minutes Intravenous Every 24 hours 03/22/22 0859 03/24/22 0749   03/22/22 1000  metroNIDAZOLE (FLAGYL) IVPB 500 mg        500 mg 100 mL/hr over 60 Minutes Intravenous Every 12 hours 03/22/22 0859     03/22/22 0522  ciprofloxacin (CIPRO) 400 MG/200ML IVPB       Note to Pharmacy: Nicholes Rough S: cabinet override      03/22/22 0522 03/22/22 0556   03/22/22 0500  ciprofloxacin (CIPRO) IVPB 200 mg        200 mg 100 mL/hr over 60 Minutes Intravenous  Once 03/22/22 0400     03/22/22 0430  piperacillin-tazobactam (ZOSYN) 3.375 GM/50ML IVPB       Note to Pharmacy: Herbie Drape J: cabinet override      03/22/22 0430 03/22/22 1644        Assessment/Plan SBO  POD2 s/p exploratory laparotomy, small bowel resection, LOA 03/22/22 Dr. Marlou Starks - continue NGT to LIWS  - await return in bowel function  - mobilize - complicated nutritional status with shock liver, will need to way risks of TPN carefully if no bowel function in the next day or so  - BID wet to dry dressing to midline  ABL anemia - post-op and suspect some hemodilution as well, hgb is 8.2<8.8<9.6, continue to monitor and transfuse for hgb < 7.0   FEN: NPO, IVF, NGT to LIWS  VTE: SCDs, ok to have SQH or LMWH and monitor hgb , discussed with CCM ID: [id number] through POD5, leukocytosis improving overall   - below per CCM -  COPD with acute post-op hypoxic respiratory failure - extubated 1/9 Septic shock - improving post-op, no pressor requirement  AKI - Cr up to 3 today, UOP low,  consider renal consult  Shock liver - LFTs remain elevated, avoid IV tylenol and hopefully can avoid TPN CAD HTN Hyperglycemia - SSI  LOS: 3 days     Norm Parcel, [facility] 03/24/2022, 9:53 AM Please see Amion for pager number during day hours 7:00am-4:30pm

## 2022-03-24 NOTE — Progress Notes (Signed)
[facility] Progress Note Patient Name: Teresa Wright DOB: 1934-10-31 MRN: [id number]   Date of Service  03/24/2022  HPI/Events of Note  Agitation - Patient hollering out. Daughter reports ETOH use at home. QTc interval = 0.422 seconds.  eICU Interventions  Plan: Haldol 1 mg IV X 1.      Intervention Category Major Interventions: Delirium, psychosis, severe agitation - evaluation and management  Betta Balla [place] 03/24/2022, 6:05 AM

## 2022-03-24 NOTE — Progress Notes (Signed)
PT Cancellation Note  Patient Details Name: Teresa Wright MRN: [id number] DOB: 1935/01/21   Cancelled Treatment:    Reason Eval/Treat Not Completed: Medical issues which prohibited therapy--agitation with haldol administer this a.m. Will hold PT for now. Will check back as schedule allows.    [facility]  Office: [phone]

## 2022-03-25 DIAGNOSIS — K56609 Unspecified intestinal obstruction, unspecified as to partial versus complete obstruction: Secondary | ICD-10-CM | POA: Diagnosis not present

## 2022-03-25 LAB — AMMONIA: Ammonia: 31 umol/L (ref 9–35)

## 2022-03-25 LAB — COMPREHENSIVE METABOLIC PANEL
ALT: 1746 U/L — ABNORMAL HIGH (ref 0–44)
AST: 1784 U/L — ABNORMAL HIGH (ref 15–41)
Albumin: 4.4 g/dL (ref 3.5–5.0)
Alkaline Phosphatase: 49 U/L (ref 38–126)
Anion gap: 12 (ref 5–15)
BUN: 56 mg/dL — ABNORMAL HIGH (ref 8–23)
CO2: 32 mmol/L (ref 22–32)
Calcium: 7.6 mg/dL — ABNORMAL LOW (ref 8.9–10.3)
Chloride: 97 mmol/L — ABNORMAL LOW (ref 98–111)
Creatinine, Ser: 3.88 mg/dL — ABNORMAL HIGH (ref 0.44–1.00)
GFR, Estimated: 11 mL/min — ABNORMAL LOW (ref >60)
Glucose, Bld: 207 mg/dL — ABNORMAL HIGH (ref 70–99)
Potassium: 4.5 mmol/L (ref 3.5–5.1)
Sodium: 141 mmol/L (ref 135–145)
Total Bilirubin: 0.9 mg/dL (ref 0.3–1.2)
Total Protein: 6 g/dL — ABNORMAL LOW (ref 6.5–8.1)

## 2022-03-25 LAB — GLUCOSE, CAPILLARY
Glucose-Capillary: 101 mg/dL — ABNORMAL HIGH (ref 70–99)
Glucose-Capillary: 137 mg/dL — ABNORMAL HIGH (ref 70–99)
Glucose-Capillary: 87 mg/dL (ref 70–99)
Glucose-Capillary: 93 mg/dL (ref 70–99)

## 2022-03-25 LAB — CBC
HCT: 22.6 % — ABNORMAL LOW (ref 36.0–46.0)
Hemoglobin: 7.2 g/dL — ABNORMAL LOW (ref 12.0–15.0)
MCH: 28.5 pg (ref 26.0–34.0)
MCHC: 31.9 g/dL (ref 30.0–36.0)
MCV: 89.3 fL (ref 80.0–100.0)
Platelets: 126 10*3/uL — ABNORMAL LOW (ref 150–400)
RBC: 2.53 MIL/uL — ABNORMAL LOW (ref 3.87–5.11)
RDW: 14.6 % (ref 11.5–15.5)
WBC: 12.2 10*3/uL — ABNORMAL HIGH (ref 4.0–10.5)
nRBC: 0.2 % (ref 0.0–0.2)

## 2022-03-25 LAB — MAGNESIUM: Magnesium: 1.9 mg/dL (ref 1.7–2.4)

## 2022-03-25 LAB — PROTIME-INR
INR: 1.7 — ABNORMAL HIGH (ref 0.8–1.2)
Prothrombin Time: 20.2 seconds — ABNORMAL HIGH (ref 11.4–15.2)

## 2022-03-25 LAB — PHOSPHORUS: Phosphorus: 5 mg/dL — ABNORMAL HIGH (ref 2.5–4.6)

## 2022-03-25 MED ORDER — SODIUM CHLORIDE 0.9 % IV SOLN
0.0000 mg/h | INTRAVENOUS | Status: DC
  Administered 2022-03-25: 1 mg/h via INTRAVENOUS
  Filled 2022-03-25: qty 5

## 2022-03-25 MED ORDER — ACETAMINOPHEN 325 MG PO TABS: 650.0000 mg | ORAL_TABLET | Freq: Four times a day (QID) | ORAL | Status: DC | PRN

## 2022-03-25 MED ORDER — HEPARIN SODIUM (PORCINE) 5000 UNIT/ML IJ SOLN
5000.0000 [IU] | Freq: Three times a day (TID) | INTRAMUSCULAR | Status: DC
  Administered 2022-03-25: 5000 [IU] via SUBCUTANEOUS
  Filled 2022-03-25: qty 1

## 2022-03-25 MED ORDER — GLYCOPYRROLATE 1 MG PO TABS: 1.0000 mg | ORAL_TABLET | ORAL | Status: DC | PRN

## 2022-03-25 MED ORDER — ACETAMINOPHEN 650 MG RE SUPP: 650.0000 mg | Freq: Four times a day (QID) | RECTAL | Status: DC | PRN

## 2022-03-25 MED ORDER — GLYCOPYRROLATE 0.2 MG/ML IJ SOLN: 0.2000 mg | INTRAMUSCULAR | Status: DC | PRN

## 2022-03-25 MED ORDER — HYDROMORPHONE BOLUS VIA INFUSION
1.0000 mg | INTRAVENOUS | Status: DC | PRN
  Administered 2022-03-25: 1 mg via INTRAVENOUS

## 2022-03-25 MED ORDER — VITAL HIGH PROTEIN PO LIQD
1000.0000 mL | ORAL | Status: DC
  Administered 2022-03-25: 1000 mL

## 2022-03-25 MED ORDER — OSMOLITE 1.5 CAL PO LIQD
1000.0000 mL | ORAL | Status: DC
  Administered 2022-03-25: 1000 mL
  Filled 2022-03-25: qty 1000

## 2022-03-25 MED ORDER — POLYVINYL ALCOHOL 1.4 % OP SOLN: 1.0000 [drp] | Freq: Four times a day (QID) | OPHTHALMIC | Status: DC | PRN

## 2022-03-25 MED ORDER — LORAZEPAM 2 MG/ML IJ SOLN: 2.0000 mg | INTRAMUSCULAR | Status: DC | PRN

## 2022-03-25 NOTE — Progress Notes (Signed)
Chaplain was paged to support Teresa Wright and her family after they made the decision to pursue comfort measures only.  Teresa Wright stated several times that she was "ready to go up" and "ready to go home." She also begged to be "let go."  Teresa Wright's daughter encouraged her mother to let go and gave her permission to do so.  Chaplain provided prayer for Teresa Wright and her family.  Spiritual Care will continue to follow, but please also page Teresa Wright as needs arise.  [address] [place] Pager, [phone]

## 2022-03-25 NOTE — Progress Notes (Signed)
NAME:  Teresa Wright, MRN:  [id number], DOB:  [birth year], LOS: 4 ADMISSION DATE:  03/21/2022, CONSULTATION DATE:  1/8 REFERRING MD:  Dr. Grandville Silos TRH, CHIEF COMPLAINT:  SBO/post op vent.    History of Present Illness:  Patient is encephalopathic and/or intubated. Therefore history has been obtained from chart review. 88 year old female with PMH as below, which is significant for COPD, HTN, CAD. She is visiting from [place] and presented to [facility] ED 1/7 with complaints of abdominal pain and vomiting. CT consistent with SBO and transition point in the RLQ. She was admitted to the hospitalist service for NGT decompression and bowel rest. In the early AM hours of 1/8 she had a significant worsening of pain and became hypotensive. She was taken emergently to the OR by Dr. Marlou Starks. She underwent exploratory laparotomy, which discovered an area of ischemic small bowel, which was resected. Bowel to bowel anastomosis was created and her abdomen was closed. She was slow to arouse post-operatively so she remained on the vent and she was transferred to ICU. PCCM consulted by [facility] for vent care.   Pertinent  Medical History   has a past medical history of COPD (chronic obstructive pulmonary disease) ([place]), Hypertension, and MI (myocardial infarction) ([place]).   Significant Hospital Events: Including procedures, antibiotic start and stop dates in addition to other pertinent events   1/7 admit with SBO 1/8 taken emergently to OR.  1/9 extubated 1/10 started on CIWA for agitated delirium   Interim History / Subjective:  Episode of agitation x 2 overnight, better after pain meds.  Has not needed ativan since yesterday Good UOP after lasix/ albumin, renal function worse Son at bedside   Objective   Blood pressure (!) 145/58, pulse 80, temperature 98.1 F (36.7 C), temperature source Axillary, resp. rate 11, height 5\' 1"  (1.549 m), weight 42.6 kg, SpO2 98 %.        Intake/Output Summary (Last 24  hours) at 03/25/2022 0759 Last data filed at 03/25/2022 0600 Gross per 24 hour  Intake 823.46 ml  Output 1870 ml  Net -1046.54 ml   Filed Weights   03/21/22 0444  Weight: 42.6 kg    Examination: General:  frail and ill appearing elderly female  HEENT: unable to assess MM, pupils 3/sluggish, anicteric  Neuro:  lethargic, will awaken and look around, follows simple commands, MAE weakly but did not verbalize CV: rr, no murmur PULM:  non labored, scattered rhonchi, 5L [place], no wheeze GI: soft, bs+, NT, midline incision dressing CDI Extremities: warm/dry, +1 generalized edema   Skin: ecchymosis to BUE  Afebrile NGT output 450  UOP 1.4L/ 24 hr (after 80mg  lasix) - 1L Net +7.2L  CBGs (on D10) 84-137  Labs> K 4.5, BUN/ sCr 47/ 3.05> 56/ 3.88, phos 5, Mag 1.9, AST/ ALT 2652/ 2155> 1784/ 1746, WBC 11> 12.2, Hgb 8.2> 7.2, plts 152> 126, INR 2> 1.7   Resolved Hospital Problem list   Metabolic acidosis  Shock   Assessment & Plan:   Small bowel obstruction: small bowel ischemic now s/p resection of that area.  - per CCS management - starting trickle feeds today via NGT - remains NPO - cont cefepime/ flagyl for 5 days per CCS - BID wet/ dry dressing changes per CCS - dilaudid prn pain, avoid tylenol with LFTs  Acute hypoxemic respiratory failure: post op failure.  Hemoptysis, 1/9 - extubated 1/9.  No further episodes of hemoptysis - poor cough and secretion clearance with worsening rhonchi since  yesterday, in addition to her encephalopathy, likely she is aspirating and developing a PNA.  Abx as above.  NTS prn.  Addressing encephalopathy below.  If worsening respiratory failure> will transition to comfort care as remains DNI/ DNR  - cont to wean supplemental O2 for sat goal > 92% - aggressive pulm hygiene> IS, moblize, PT  COPD without exacerbation - cont to hold trelegy - Budesonide, brovana, yupelri, prn albuterol   ABLA Hemoptysis 1/9 - 1gm drop in Hgb since yesterday  after starting ppx SQ heparin.  No evidence of active bleeding.   - continue to monitor closely - cont PPI BID - transfuse for Hgb < 7.  T&S is current.   AKI with oliguria , suspect ATN Hypocalcemia  - good UOP/ response to lasix yesterday but sCr up, electrolytes ok  - additional albumin/ lasix today - cont foley - patient would not want any form of dialysis - Trend BMP / strict I/O's  - Replace electrolytes as indicated - Avoid nephrotoxic agents, ensure adequate renal perfusion - no renal obstruction/ hydro on CT a/p 1/7  Transaminitis with coagulapathy  - felt to be shock liver, starting to improve - RUQ [place] 1/10 > c/w fatty liver, patent portal vein - improving INR, cont to monitor/ trend   CAD HTN MI - cont to hold PO losartan, metoprolol, lipitor  - cont lopressor 2.5mg  IV q4hrs - if tolerating trickle feeds> can switch from IV to enteral  - PRN hydralazine - cont holding plavix   Hyperglycemia, resolved since borderline hypoglycemia - remains on D10 gtt at 20.  Starting trickle TF today.  Likely can stop D10 - cont CBGs q4hrs  At risk for malnutrition  - trickle TF today   Acute metabolic encephalopathy vs agitated delirium vs ETOH w/d - reported drinks red wine nightly - cont CIWA protocol with ativan/ thiamine> has not needed much overnight - delirium precautions    Best Practice (right click and "Reselect all SmartList Selections" daily)   Diet/type: NPO; trickle TF per NGT DVT prophylaxis: SCD / heparin SQ GI prophylaxis: PPI Lines: Central line- PICC LUE Foley:  Yes, and it is still needed Code Status:  DNR Last date of multidisciplinary goals of care discussion 1/10  Son, Chrissie Noa updated at bedside.  Plan of care continues to maintain current coarse in hopes of improvement.  Guarded prognosis with concern for encephalopathy, respiratory status, and unclear if renal function will improve.  If any decompensation, will transition to comfort focused  care.  Remains DNR/ DNI, no vasopressor support.   Will transfer to SDU and TRH as of 1/12. PCCM available as needed.   Labs   CBC: Recent Labs  Lab 03/22/22 0731 03/22/22 1203 03/23/22 0331 03/24/22 0406 03/25/22 0303  WBC 16.0* 12.8* 9.3 11.0* 12.2*  HGB 10.8* 9.6* 8.8* 8.2* 7.2*  HCT 36.6 31.1* 26.8* 24.8* 22.6*  MCV 97.1 92.0 87.3 87.6 89.3  PLT 198 190 152 152 126*    Basic Metabolic Panel: Recent Labs  Lab 03/22/22 0731 03/22/22 1203 03/23/22 0331 03/23/22 1500 03/24/22 0406 03/25/22 0303  NA 142 143 142 140 136 141  K 5.3* 3.8 2.6* 3.0* 4.5 4.5  CL 111 110 102 96* 97* 97*  CO2 11* 21* 30 32 32 32  GLUCOSE 132* 244* 177* 145* 79 207*  BUN 21 28* 37* 41* 47* 56*  CREATININE 1.84* 1.79* 2.45* 2.51* 3.05* 3.88*  CALCIUM 6.6* 6.8* 6.8* 6.4* 6.7* 7.6*  MG 1.3*  --  1.9  --  1.8 1.9  PHOS 9.3*  --  2.1*  --  2.8 5.0*   GFR: Estimated Creatinine Clearance: 7.1 mL/min (A) (by C-G formula based on SCr of 3.88 mg/dL (H)). Recent Labs  Lab 03/22/22 0928 03/22/22 1203 03/22/22 1516 03/23/22 0331 03/23/22 1037 03/24/22 0406 03/25/22 0303  WBC  --  12.8*  --  9.3  --  11.0* 12.2*  LATICACIDVEN >9.0* 5.9* 5.8*  --  3.1*  --   --     Liver Function Tests: Recent Labs  Lab 03/22/22 0731 03/22/22 1203 03/23/22 0331 03/24/22 0406 03/25/22 0303  AST 1,262* 1,783* 2,463* 2,652* 1,784*  ALT 1,131* 1,800* 1,496* 2,155* 1,746*  ALKPHOS 40 40 33* 50 49  BILITOT 0.5 0.6 0.5 0.8 0.9  PROT 3.7* 3.9* 3.6* 4.8* 6.0*  ALBUMIN 2.1* 2.3* 2.1* 2.9* 4.4   Recent Labs  Lab 03/21/22 0447  LIPASE 16   Recent Labs  Lab 03/23/22 1037 03/25/22 0303  AMMONIA 26 31    ABG    Component Value Date/Time   PHART 7.24 (L) 03/22/2022 0855   PCO2ART 27 (L) 03/22/2022 0855   PO2ART 234 (H) 03/22/2022 0855   HCO3 12.3 (L) 03/22/2022 0855   TCO2 10 (L) 03/22/2022 0549   ACIDBASEDEF 15.0 (H) 03/22/2022 0855   O2SAT 100 03/22/2022 0855     Coagulation Profile: Recent  Labs  Lab 03/22/22 1203 03/23/22 0331 03/24/22 0406 03/25/22 0303  INR 2.9* 2.8* 2.0* 1.7*    Cardiac Enzymes: No results for input(s): "CKTOTAL", "CKMB", "CKMBINDEX", "TROPONINI" in the last 168 hours.  HbA1C: Hgb A1c MFr Bld  Date/Time Value Ref Range Status  03/21/2022 09:51 AM 6.3 (H) 4.8 - 5.6 % Final    Comment:    (NOTE)         Prediabetes: 5.7 - 6.4         Diabetes: >6.4         Glycemic control for adults with diabetes: <7.0     CBG: Recent Labs  Lab 03/24/22 1555 03/24/22 1934 03/25/22 0017 03/25/22 0312 03/25/22 0746  GLUCAP 84 90 87 137* 93    Critical care time: n/a     Federico Flake Pul[facility] 03/25/2022, 7:59 AM  See Amion for pager If no response to pager, please call PCCM consult pager After 7:00 pm call Elink

## 2022-03-25 NOTE — Progress Notes (Signed)
Progress Note  3 Days Post-Op  Subjective: Pt confused and agitated. Daughter and son at bedside this AM. NGT output remains fairly low. Patient not complaining of abdominal pain specifically. Unsure if flatus. Family reports patient drinks a few glasses of wine daily at baseline, unsure if she has ever had withdrawal from alcohol before.   Objective: Vital signs in last 24 hours: Temp:  [98.1 F (36.7 C)-99.5 F (37.5 C)] 98.1 F (36.7 C) (01/11 0300) Pulse Rate:  [74-129] 104 (01/11 0800) Resp:  [10-23] 15 (01/11 0800) BP: (114-177)/(44-118) 177/75 (01/11 0800) SpO2:  [82 %-100 %] 93 % (01/11 0800) Arterial Line BP: (93-120)/(35-37) 98/36 (01/10 1500) Last BM Date :  (PTA)  Intake/Output from previous day: 01/10 0701 - 01/11 0700 In: 823.5 [I.V.:355; IV Piggyback:468.4] Out: 1870 [Urine:1420; Emesis/NG output:450] Intake/Output this shift: Total I/O In: 40 [I.V.:40] Out: 150 [Urine:150]  PE: General: pleasant, WD, thin elderly female who is alert and agitated  Heart: regular, rate, and rhythm.   Lungs: rales bilaterally  Abd: soft, appropriately ttp, ND, NGT with thin bilious drainage, midline wound clean GU: foley present   Lab Results:  Recent Labs    03/24/22 0406 03/25/22 0303  WBC 11.0* 12.2*  HGB 8.2* 7.2*  HCT 24.8* 22.6*  PLT 152 126*    BMET Recent Labs    03/24/22 0406 03/25/22 0303  NA 136 141  K 4.5 4.5  CL 97* 97*  CO2 32 32  GLUCOSE 79 207*  BUN 47* 56*  CREATININE 3.05* 3.88*  CALCIUM 6.7* 7.6*    PT/INR Recent Labs    03/24/22 0406 03/25/22 0303  LABPROT 22.9* 20.2*  INR 2.0* 1.7*    CMP     Component Value Date/Time   NA 141 03/25/2022 0303   K 4.5 03/25/2022 0303   CL 97 (L) 03/25/2022 0303   CO2 32 03/25/2022 0303   GLUCOSE 207 (H) 03/25/2022 0303   BUN 56 (H) 03/25/2022 0303   CREATININE 3.88 (H) 03/25/2022 0303   CALCIUM 7.6 (L) 03/25/2022 0303   PROT 6.0 (L) 03/25/2022 0303   ALBUMIN 4.4 03/25/2022 0303    AST 1,784 (H) 03/25/2022 0303   ALT 1,746 (H) 03/25/2022 0303   ALKPHOS 49 03/25/2022 0303   BILITOT 0.9 03/25/2022 0303   GFRNONAA 11 (L) 03/25/2022 0303   Lipase     Component Value Date/Time   LIPASE 16 03/21/2022 0447       Studies/Results: US Abdomen Limited RUQ (LIVER/GB)  Result Date: 03/24/2022 CLINICAL DATA:  Transaminitis. EXAM: ULTRASOUND ABDOMEN LIMITED RIGHT UPPER QUADRANT COMPARISON:  CT of the abdomen pelvis 03/21/2022 FINDINGS: Gallbladder: No gallstones or wall thickening visualized. No sonographic Murphy sign noted by sonographer. Common bile duct: Diameter: 2.3 mm, within normal limits Liver: The liver is somewhat echogenic and hyperechoic. No discrete lesions are present. Portal vein is patent on color Doppler imaging with normal direction of blood flow towards the liver. Other: None. IMPRESSION: 1. Fatty infiltration of the liver. 2. Normal sonographic appearance of the gallbladder. Electronically Signed   By: San Morelle M.D.   On: 03/24/2022 09:33   [place] EKG SITE RITE  Result Date: 03/23/2022 If Site Rite image not attached, placement could not be confirmed due to current cardiac rhythm.   Anti-infectives: Anti-infectives (From admission, onward)    Start     Dose/Rate Route Frequency Ordered Stop   03/24/22 1000  ceFEPIme (MAXIPIME) 1 g in sodium chloride 0.9 % 100 mL IVPB  1 g 200 mL/hr over 30 Minutes Intravenous Every 24 hours 03/24/22 0749     03/22/22 1000  ceFEPIme (MAXIPIME) 2 g in sodium chloride 0.9 % 100 mL IVPB  Status:  Discontinued        2 g 200 mL/hr over 30 Minutes Intravenous Every 24 hours 03/22/22 0859 03/24/22 0749   03/22/22 1000  metroNIDAZOLE (FLAGYL) IVPB 500 mg        500 mg 100 mL/hr over 60 Minutes Intravenous Every 12 hours 03/22/22 0859     03/22/22 0522  ciprofloxacin (CIPRO) 400 MG/200ML IVPB       Note to Pharmacy: Nicholes Rough S: cabinet override      03/22/22 0522 03/22/22 0556   03/22/22 0500  ciprofloxacin  (CIPRO) IVPB 200 mg        200 mg 100 mL/hr over 60 Minutes Intravenous  Once 03/22/22 0400     03/22/22 0430  piperacillin-tazobactam (ZOSYN) 3.375 GM/50ML IVPB       Note to Pharmacy: Herbie Drape J: cabinet override      03/22/22 0430 03/22/22 1644        Assessment/Plan SBO  POD3 s/p exploratory laparotomy, small bowel resection, LOA 03/22/22 Dr. Marlou Starks - NGT output bilious but low, ok to try trophic tube feeds today - if any concern for nausea or distention, reconnect NGT to LIWS and check residual  - mobilize - BID wet to dry dressing to midline  ABL anemia - post-op and suspect some hemodilution as well, hgb is 7.2<8.2<8.8<9.6, recommend transfusing 1 unit PRBC today, discussed with CCM  FEN: NPO, IVF, trophic TF to start today  VTE: SCDs, SQH  ID: cefepime/flagyl through POD5, WBC 12  - below per CCM -  COPD with acute post-op hypoxic respiratory failure - extubated 1/9 Septic shock - improving post-op, no pressor requirement  AKI - Cr up to 3.8 today, UOP improved with lasix challenge yesterday, patient reportedly would not want dialysis even if temporary  Shock liver - LFTs trending down this AM, continue to avoid tylenol for now  CAD HTN Hyperglycemia - SSI  LOS: 4 days     Norm Parcel, [facility] Surgery 03/25/2022, 8:36 AM Please see Amion for pager number during day hours 7:00am-4:30pm

## 2022-03-25 NOTE — Progress Notes (Signed)
SLP Cancellation Note  Patient Details Name: Teresa Wright MRN: [id number] DOB: 07/29/1934   Cancelled treatment:       Reason Eval/Treat Not Completed: Fatigue/lethargy limiting ability to participate  Per RN, pt is currently somnolent and is inappropriate for evaluation. Given current status, SLP will sign off for now. Please reconsult if needs arise.  Delene Morais B. Quentin Ore, [facility], [place] Speech Language Pathologist Office: [phone]  Shonna Chock 03/25/2022, 11:49 AM

## 2022-03-25 NOTE — Progress Notes (Signed)
Nutrition Brief Note  Chart reviewed. Pt now transitioning to comfort care.  No further nutrition interventions planned at this time.  Please re-consult as needed.     Antwonette Feliz RD, LDN For contact information, refer to [facility].   

## 2022-03-25 NOTE — Progress Notes (Signed)
PT Cancellation Note  Patient Details Name: Teresa Wright MRN: [id number] DOB: 1934/07/30   Cancelled Treatment:    Reason Eval/Treat Not Completed: Medical issues which prohibited therapy [place] Office [phone] Weekend [phone]   Claretha Cooper 03/25/2022, 9:44 AM

## 2022-03-25 NOTE — Progress Notes (Signed)
Nutrition Follow-up  DOCUMENTATION CODES:   Non-severe (moderate) malnutrition in context of chronic illness, Underweight  INTERVENTION:  - Per Surgery, plan to start trophic feeds at 78mL/hr today.  - Once able to advance TF, recommend below TF regimen: Osmolite 1.5 at 45 ml/h (1080 ml per day) *Would recommend advancing by 63mL Q12H due to risk of refeeding and SBO once OK per Surgery Prosource TF20 60 ml daily Provides 1700 kcal, 88 gm protein, 823 ml free water daily  - Monitor magnesium, potassium, and phosphorus BID for at least 3 days, MD to replete as needed, as pt is at risk for refeeding syndrome. - Continue 100mg  thiamine daily. - Monitor weight trends.    NUTRITION DIAGNOSIS:   Moderate Malnutrition related to chronic illness (COPD, CAD) as evidenced by severe fat depletion, moderate muscle depletion.  GOAL:   Patient will meet greater than or equal to 90% of their needs  MONITOR:   Diet advancement, Labs, Weight trends, TF tolerance  REASON FOR ASSESSMENT:   Consult Enteral/tube feeding initiation and management  ASSESSMENT:   88 y.o. female with medical history significant of HTN, CAD, COPD. Presenting with abdominal pain. Admitted 1/7 with SBO taken for ex lap, LOA, small bowel resection/end to end anastomosis, brought to ICU post op on vent.  1/7 Admit 1/8 ex lap, small bowel resection/end to end anastomosis - remained intubated post op 1/9 Extubated 1/11 starting trophic feeds at 17mL/hr  Patient remains NPO with NGT to LIS. Output fairly low.  Per Surgery, plan to start TF at 68mL/hr today and assess tolerance. Discussed Osmolite 1.5 formula for no fiber and Surgery PA in agreement. Discussed plan with RN.  Discussed with daughter at bedside. Addressed all questions and concerns.  Per CCM, plan to wean D10 now that starting tube feeds.  SLP eval today but note indicates patient somnolent and inappropriate for eval today.    Medications  reviewed and include: Thiamine, D10W at 39mL(163kcals over 24 hours)  Labs reviewed:  Creatinine 3.88 Phosphorus 5.0 HA1C 6.3 Blood Glucose 70-137 x24 hours   NUTRITION - FOCUSED PHYSICAL EXAM:  Flowsheet Row Most Recent Value  Orbital Region Severe depletion  Upper Arm Region Moderate depletion  Thoracic and Lumbar Region Moderate depletion  Buccal Region Severe depletion  Temple Region Moderate depletion  Clavicle Bone Region Moderate depletion  Clavicle and Acromion Bone Region Severe depletion  Scapular Bone Region Unable to assess  Dorsal Hand Mild depletion  Patellar Region Moderate depletion  Anterior Thigh Region Moderate depletion  Posterior Calf Region Moderate depletion  Edema (RD Assessment) Mild  Hair Reviewed  Eyes Reviewed  Mouth Unable to assess  Skin Reviewed  Nails Reviewed       Diet Order:   Diet Order             Diet NPO time specified  Diet effective now                   EDUCATION NEEDS:  Not appropriate for education at this time  Skin:  Skin Assessment: Skin Integrity Issues: Skin Integrity Issues:: Incisions Incisions: 1/8 abdomen  Last BM:  1/6  Height:  Ht Readings from Last 1 Encounters:  03/21/22 5\' 1"  (1.549 m)   Weight:  Wt Readings from Last 1 Encounters:  03/21/22 42.6 kg    BMI:  Body mass index is 17.76 kg/m.  Estimated Nutritional Needs:  Kcal:  1500-1700 Protein:  75-90g Fluid:  1.7L/day    [place]field Glade,  LDN For contact information, refer to [facility].

## 2022-03-25 NOTE — IPAL (Signed)
  Interdisciplinary Goals of Care Family Meeting   Date carried out: 03/25/2022  Location of the meeting: Conference room  Member's involved: Nurse Practitioner, Bedside Registered Nurse, and Family Member or next of kin  Durable Power of Attorney or acting medical decision maker: Abe People, son- by phone, and daughter, Erasmo Downer in person  Discussion: We discussed goals of care for Baylei C Wright-Buczek .  Patient has become more alert, but still confused this afternoon, clearly having conversations with her mother and sister who have passed, asking calming "can I go yet", asking to take it (NGT) out, and asking to just let her die.  Daughter, Erasmo Downer at bedside wanted to have another discussion with Abe People (who is legal HPOA), about transitioning to full comfort care and not prolonging her suffering.  Decision was made for full transition to comfort.  Family suspect that she would want to pass in the hospital.  Ongoing emotional family support offered.   Full comfort measures ordered.  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   Code status:   Code Status: DNR   Disposition: In-patient comfort care  Time spent for the meeting: 20 mins.        Kennieth Rad, Edmonia Caprio [place] [facility] 03/25/2022, 3:13 PM  See Amion for pager If no response to pager, please call PCCM consult pager After 7:00 pm call Elink

## 2022-04-15 NOTE — Progress Notes (Signed)
Patient time of death at 59. Notified family and daughter sat in room with patient, completed post-mortem care and flowsheet. Patient placement called and said to notify dayshift MD Inda Merlin, MD) as well.     09-Apr-2022 0400  Attending [place]  Attending Physician Notified Y  Attending Physician (First and Last Name) Bernarda Caffey via Warren Lacy and Raenette Rover, NP via Page Inda Merlin, MD notified as well per patient placement's request)  Post Mortem Checklist  Date of Death 2022-04-09  Time of Death 0358  Pronounced By Sheryn Bison, RN & Lucille "Carla" Camillia Herter, Charge RN  Next of kin notified Yes  Name of next of kin notified of death Gwyndolyn Saxon "[place]" Joya Gaskins (Son) and Shawnie Pons (Daughter)  Contact Person's Relationship to Patient Health care power of attorney;Son;Daughter  Contact Person's Phone Number [phone] (William/Son); [phone] (Kristin/Daughter)  Contact Person's address Son lives in [place], Daughter's address is  Family Communication Notes Called Daughter Erasmo Downer and left a voicemail at [place] and he was able to talk to his sister Erasmo Downer at [phone], Erasmo Downer on her way to view patient's body  Was the patient a No Code Blue or a Limited Code Blue? Yes  Did the patient die unattended? Yes  Patient restrained? Not applicable  Height 5\' 1"  (1.549 m)  Body preparation complete Y  [employer] (previously known as Brewing technologist)  Notification Date 04-09-22  Notification Time 3335  [employer] Number [phone] (Talked to [facility])  Is patient a potential donor? N  Autopsy  Autopsy requested by MD or Family ( Non ME Case) N/A  Patient and [place] Returned  Patient is satisfied that all belongings have been returned? Yes  Name of person receiving valuables? [employer]  Specify valuables returned Glasses, Teeth, R. Ring finger has ring not able to remove in surgery, Note from grandson   Dermatherapy linen/gowns NOT sent with patient or transporter Not applicable  Dead on Arrival (Emergency Department)  Patient dead on arrival? No  Notifications  Patient Placement notified that Post Mortem checklist is complete Yes  Patient Placement notified body transferred Transported to [place]  Is this a medical examiner's case? [place] home name/address/phone # Shirley Friar [facility] - [address] [place] - [phone]  Planned location of pickup [facility]

## 2022-04-15 NOTE — Discharge Summary (Signed)
DEATH SUMMARY   Patient Details  Name: Teresa Wright MRN: [id number] DOB: November 16, 1934  Admission/Discharge Information   Admit Date:  04-15-2022  Date of Death: Date of Death: 04/20/22  Time of Death: Time of Death: 0358  Length of Stay: 5  Referring Physician: Pcp, No   Reason(s) for Hospitalization  SBO Acute hypoxic respiratory failure  Acute renal failure Shock liver  Diagnoses  Preliminary cause of death:  Secondary Diagnoses (including complications and co-morbidities):  Principal Problem:   SBO (small bowel obstruction) (HCC) Active Problems:   CAD (coronary artery disease)   HTN (hypertension)   COPD (chronic obstructive pulmonary disease) (HCC)   Hyperglycemia   Acute respiratory failure with hypoxia (HCC)   Metabolic acidosis   ARF (acute renal failure) ([place])   Septic shock [facility])   Brief Hospital Course (including significant findings, care, treatment, and services provided and events leading to death)  DAMYA COMLEY is a 88 y.o. year old female who was admitted 04-15-2022 with developing SBO who was taken to OR 04-16-22 for ex lap, LOA, resection of bowel and anastomosis, brought to ICU intubated. Course was complicated by prolonged mechanical ventilation for acute hypoxic respiratory failure, acute metabolic encephalopathy, acute renal failure. As it became clear her recovery would at best be prolonged and with lower quality of live than pt would find acceptable, decision made for DNR/comfort on 1/11 in discussion with family.    Pertinent Labs and Studies  Significant Diagnostic Studies US Abdomen Limited RUQ (LIVER/GB)  Result Date: 03/24/2022 CLINICAL DATA:  Transaminitis. EXAM: ULTRASOUND ABDOMEN LIMITED RIGHT UPPER QUADRANT COMPARISON:  CT of the abdomen pelvis April 15, 2022 FINDINGS: Gallbladder: No gallstones or wall thickening visualized. No sonographic Murphy sign noted by sonographer. Common bile duct: Diameter: 2.3 mm, within normal limits Liver: The  liver is somewhat echogenic and hyperechoic. No discrete lesions are present. Portal vein is patent on color Doppler imaging with normal direction of blood flow towards the liver. Other: None. IMPRESSION: 1. Fatty infiltration of the liver. 2. Normal sonographic appearance of the gallbladder. Electronically Signed   By: San Morelle M.D.   On: 03/24/2022 09:33   [place] EKG SITE RITE  Result Date: 03/23/2022 If Site Rite image not attached, placement could not be confirmed due to current cardiac rhythm.  DG Chest 1 View  Result Date: 03/23/2022 CLINICAL DATA:  Increased tracheal secretions. EXAM: CHEST  1 VIEW COMPARISON:  Chest radiograph Apr 16, 2022, 15-Apr-2022 FINDINGS: Endotracheal tube tip projects approximately 4.5 cm above the carina. Enteric tube tip is below the diaphragm but not included on the image. Hazy bibasilar airspace opacities, right-greater-than-left with moderate right and small left pleural effusions. No pneumothorax. Heart is normal in size. Aortic calcifications. IMPRESSION: Worsening bibasilar airspace opacities, right-greater-than-left, which may reflect atelectasis versus pneumonia in the appropriate clinical context. Moderate right and small left pleural effusions, increased compared to yesterday's exam. Electronically Signed   By: Ileana Roup M.D.   On: 03/23/2022 08:39   DG Chest Port 1 View  Result Date: April 16, 2022 CLINICAL DATA:  Shortness of breath. EXAM: PORTABLE CHEST 1 VIEW COMPARISON:  2022-04-15 and CT abdomen 04-15-2022. FINDINGS: Endotracheal tube terminates approximately 2.8 cm above the carina. Nasogastric tube is followed into the stomach with the tip projecting beyond the inferior margin of the image. Heart size normal. Thoracic aorta is calcified. Small bilateral pleural effusions, layering slightly on the right given semi upright position. There may be right basilar airspace opacification as well. Lungs are otherwise clear. IMPRESSION: 1.  Small bilateral  pleural effusions, right greater than left. 2. Possible right basilar airspace opacification. Electronically Signed   By: Leanna Battles M.D.   On: 04/13/2022 08:19   DG Abd Portable 1V-Small Bowel Obstruction Protocol-initial, 8 hr delay  Result Date: 03/30/2022 CLINICAL DATA:  8 hour delay for small bowel obstruction. EXAM: PORTABLE ABDOMEN - 1 VIEW COMPARISON:  CT abdomen and pelvis 03/17/2022 FINDINGS: Enteric tube tip is at the gastroesophageal junction. Contrast is seen within the stomach. No contrast seen within small bowel or colon. Colon is mildly dilated measuring up to 3.1 cm. Minimal colonic gas present. There is contrast within the bladder. Left bladder diverticulum is present. IMPRESSION: 1. Enteric tube tip is at the gastroesophageal junction. Recommend advancing tube. 2. Contrast is only seen within the stomach. Small bowel is mildly dilated. Findings are compatible small bowel obstruction. Electronically Signed   By: Darliss Cheney M.D.   On: 04/07/2022 20:26   DG Chest Port 1 View  Result Date: 04/05/2022 CLINICAL DATA:  Nasogastric tube present. EXAM: PORTABLE CHEST 1 VIEW COMPARISON:  April 24, 2014 FINDINGS: The distal tip of the NG tube is in the gastric fundus/body. The side port is approximately 2 cm below the GE junction. IMPRESSION: The side port and distal tip of the NG tube are in the stomach as above. Electronically Signed   By: Gerome Sam III M.D.   On: 04/02/2022 08:17   CT ABDOMEN PELVIS W CONTRAST  Result Date: 03/17/2022 CLINICAL DATA:  Acute, nonlocalized abdominal pain EXAM: CT ABDOMEN AND PELVIS WITH CONTRAST TECHNIQUE: Multidetector CT imaging of the abdomen and pelvis was performed using the standard protocol following bolus administration of intravenous contrast. RADIATION DOSE REDUCTION: This exam was performed according to the departmental dose-optimization program which includes automated exposure control, adjustment of the mA and/or kV according to patient  size and/or use of iterative reconstruction technique. CONTRAST:  30mL OMNIPAQUE IOHEXOL 300 MG/ML  SOLN COMPARISON:  None Available. FINDINGS: Lower chest:  No acute finding Hepatobiliary: No focal liver abnormality.No evidence of biliary obstruction or stone. Pancreas: Unremarkable. Spleen: Subcentimeter low-density in the central spleen, non worrisome although nonspecific. Adrenals/Urinary Tract: Negative adrenals. No hydronephrosis or stone. Unremarkable bladder. Stomach/Bowel: Dilated and fluid-filled small bowel loops with mesenteric congestion. Marked transition point in the right lower quadrant with very decompressed distal loops, presumed underlying adhesive disease. Multiple distal colonic diverticula. No primary inflammatory process is seen. Vascular/Lymphatic: Severe atheromatous calcification of the aorta and iliacs with diffuse highly stenotic iliacs and common femoral arteries. Major mesenteric vessels are enhancing although there is prominent plaque at the celiac and SMA origins. No mass or adenopathy. Reproductive:Hysterectomy Other: Small volume reactive ascites Musculoskeletal: Osteopenia and generalized degeneration. No acute or focal finding IMPRESSION: 1. High-grade small bowel obstruction with transition point in the right lower quadrant. Associated mesenteric edema and trace ascites, no perforation. 2. Advanced atherosclerosis with high-grade bilateral inflow stenoses. Electronically Signed   By: Tiburcio Pea M.D.   On: 04/03/2022 06:24    Microbiology No results found for this or any previous visit (from the past 240 hour(s)).  Lab Basic Metabolic Panel: No results for input(s): "NA", "K", "CL", "CO2", "GLUCOSE", "BUN", "CREATININE", "CALCIUM", "MG", "PHOS" in the last 168 hours. Liver Function Tests: No results for input(s): "AST", "ALT", "ALKPHOS", "BILITOT", "PROT", "ALBUMIN" in the last 168 hours. No results for input(s): "LIPASE", "AMYLASE" in the last 168 hours. No  results for input(s): "AMMONIA" in the last 168 hours. CBC: No results for  input(s): "WBC", "NEUTROABS", "HGB", "HCT", "MCV", "PLT" in the last 168 hours. Cardiac Enzymes: No results for input(s): "CKTOTAL", "CKMB", "CKMBINDEX", "TROPONINI" in the last 168 hours. Sepsis Labs: No results for input(s): "PROCALCITON", "WBC", "LATICACIDVEN" in the last 168 hours.  Procedures/Operations  See Epic for details   Maryjane Hurter 04/05/2022, 5:40 PM

## 2022-04-15 NOTE — Progress Notes (Signed)
Patient had Dilaudid 50mg  in NS 0.9% 48mL. IV pump was stopped at 0415.   The residual amount left in the IVPB bag at the patient's time of death at 0358 was 12.39mL. Residual fluid of 12.14mL was wasted properly and securely in stericycle with a witness [employer] - Posey Pronto "Marya Landry) at [phone].

## 2022-04-15 DEATH — deceased
